# Patient Record
Sex: Male | Born: 1965 | Race: White | Hispanic: No | Marital: Single
Health system: Midwestern US, Academic
[De-identification: ages and names within clinical notes are randomized; demographics above are authoritative.]

---

## 2017-07-25 ENCOUNTER — Encounter: Admit: 2017-07-25 | Discharge: 2017-07-25 | Payer: MEDICARE

## 2017-07-25 DIAGNOSIS — C61 Malignant neoplasm of prostate: ICD-10-CM

## 2017-07-25 DIAGNOSIS — Z435 Encounter for attention to cystostomy: ICD-10-CM

## 2017-07-25 DIAGNOSIS — J45909 Unspecified asthma, uncomplicated: ICD-10-CM

## 2017-07-25 DIAGNOSIS — I10 Essential (primary) hypertension: Principal | ICD-10-CM

## 2017-07-25 DIAGNOSIS — N529 Male erectile dysfunction, unspecified: ICD-10-CM

## 2017-07-25 DIAGNOSIS — R339 Retention of urine, unspecified: ICD-10-CM

## 2017-07-25 DIAGNOSIS — N320 Bladder-neck obstruction: Principal | ICD-10-CM

## 2017-07-25 DIAGNOSIS — H9190 Unspecified hearing loss, unspecified ear: ICD-10-CM

## 2017-07-25 DIAGNOSIS — M549 Dorsalgia, unspecified: ICD-10-CM

## 2017-07-25 MED ORDER — OXYCODONE 5 MG PO TAB
5 mg | ORAL_TABLET | ORAL | 0 refills | 6.00000 days | Status: AC | PRN
Start: 2017-07-25 — End: 2017-07-28

## 2017-07-28 ENCOUNTER — Encounter: Admit: 2017-07-28 | Discharge: 2017-07-28 | Payer: MEDICARE

## 2017-07-30 ENCOUNTER — Encounter: Admit: 2017-07-30 | Discharge: 2017-07-30 | Payer: MEDICARE

## 2017-07-30 DIAGNOSIS — N320 Bladder-neck obstruction: Principal | ICD-10-CM

## 2017-07-31 ENCOUNTER — Encounter: Admit: 2017-07-31 | Discharge: 2017-07-31 | Payer: MEDICARE

## 2017-07-31 DIAGNOSIS — N320 Bladder-neck obstruction: Principal | ICD-10-CM

## 2017-08-01 ENCOUNTER — Ambulatory Visit: Admit: 2017-08-01 | Discharge: 2017-08-01 | Payer: MEDICARE

## 2017-08-01 DIAGNOSIS — N320 Bladder-neck obstruction: Principal | ICD-10-CM

## 2017-08-02 ENCOUNTER — Encounter: Admit: 2017-08-02 | Discharge: 2017-08-02 | Payer: MEDICARE

## 2017-08-02 ENCOUNTER — Ambulatory Visit: Admit: 2017-08-02 | Discharge: 2017-08-02 | Payer: MEDICARE

## 2017-08-02 DIAGNOSIS — J45909 Unspecified asthma, uncomplicated: ICD-10-CM

## 2017-08-02 DIAGNOSIS — I10 Essential (primary) hypertension: Principal | ICD-10-CM

## 2017-08-02 DIAGNOSIS — Z8546 Personal history of malignant neoplasm of prostate: ICD-10-CM

## 2017-08-02 DIAGNOSIS — R339 Retention of urine, unspecified: ICD-10-CM

## 2017-08-02 DIAGNOSIS — Z79899 Other long term (current) drug therapy: ICD-10-CM

## 2017-08-02 DIAGNOSIS — Z8249 Family history of ischemic heart disease and other diseases of the circulatory system: ICD-10-CM

## 2017-08-02 DIAGNOSIS — N320 Bladder-neck obstruction: Principal | ICD-10-CM

## 2017-08-02 DIAGNOSIS — Z9079 Acquired absence of other genital organ(s): ICD-10-CM

## 2017-08-02 DIAGNOSIS — N529 Male erectile dysfunction, unspecified: ICD-10-CM

## 2017-08-02 DIAGNOSIS — H9190 Unspecified hearing loss, unspecified ear: ICD-10-CM

## 2017-08-02 DIAGNOSIS — M549 Dorsalgia, unspecified: ICD-10-CM

## 2017-08-02 DIAGNOSIS — C61 Malignant neoplasm of prostate: ICD-10-CM

## 2017-08-02 MED ORDER — PHENAZOPYRIDINE 200 MG PO TAB
200 mg | ORAL_TABLET | Freq: Three times a day (TID) | ORAL | 0 refills | Status: AC | PRN
Start: 2017-08-02 — End: 2017-09-04

## 2017-08-02 MED ORDER — HYOSCYAMINE SULFATE 0.125 MG SL SUBL
125 ug | ORAL_TABLET | SUBLINGUAL | 0 refills | Status: AC | PRN
Start: 2017-08-02 — End: 2017-09-04

## 2017-08-02 MED ORDER — LACTATED RINGERS IV SOLP
1000 mL | INTRAVENOUS | 0 refills | Status: DC
Start: 2017-08-02 — End: 2017-08-02
  Administered 2017-08-02: 13:00:00 1000 mL via INTRAVENOUS

## 2017-08-02 MED ORDER — DEXAMETHASONE SODIUM PHOSPHATE 4 MG/ML IJ SOLN
INTRAVENOUS | 0 refills | Status: DC
Start: 2017-08-02 — End: 2017-08-02
  Administered 2017-08-02: 14:00:00 4 mg via INTRAVENOUS

## 2017-08-02 MED ORDER — FENTANYL CITRATE (PF) 50 MCG/ML IJ SOLN
50 ug | INTRAVENOUS | 0 refills | Status: DC | PRN
Start: 2017-08-02 — End: 2017-08-02

## 2017-08-02 MED ORDER — DEXAMETHASONE SODIUM PHOS (PF) 10 MG/ML IJ SOLN
4 mg | Freq: Once | INTRAVENOUS | 0 refills | Status: DC | PRN
Start: 2017-08-02 — End: 2017-08-02

## 2017-08-02 MED ORDER — SODIUM CHLORIDE 0.9% IRRIGATION BAG
0 refills | Status: DC
Start: 2017-08-02 — End: 2017-08-02
  Administered 2017-08-02: 14:00:00 3000 mL

## 2017-08-02 MED ORDER — MIDAZOLAM 1 MG/ML IJ SOLN
INTRAVENOUS | 0 refills | Status: DC
Start: 2017-08-02 — End: 2017-08-02
  Administered 2017-08-02: 13:00:00 2 mg via INTRAVENOUS

## 2017-08-02 MED ORDER — OXYBUTYNIN CHLORIDE 5 MG PO TAB
5 mg | ORAL_TABLET | Freq: Three times a day (TID) | ORAL | 0 refills | 12.00000 days | Status: AC | PRN
Start: 2017-08-02 — End: 2017-09-04

## 2017-08-02 MED ORDER — HYOSCYAMINE SULFATE 0.125 MG PO TBDI
.125 mg | Freq: Once | SUBLINGUAL | 0 refills | Status: CP
Start: 2017-08-02 — End: ?
  Administered 2017-08-02: 15:00:00 0.125 mg via SUBLINGUAL

## 2017-08-02 MED ORDER — PROPOFOL INJ 10 MG/ML IV VIAL
0 refills | Status: DC
Start: 2017-08-02 — End: 2017-08-02
  Administered 2017-08-02: 14:00:00 200 mg via INTRAVENOUS

## 2017-08-02 MED ORDER — DEXTRAN 70-HYPROMELLOSE (PF) 0.1-0.3 % OP DPET
0 refills | Status: DC
Start: 2017-08-02 — End: 2017-08-02
  Administered 2017-08-02: 14:00:00 2 [drp] via OPHTHALMIC

## 2017-08-02 MED ORDER — LIDOCAINE (PF) 200 MG/10 ML (2 %) IJ SYRG
0 refills | Status: DC
Start: 2017-08-02 — End: 2017-08-02
  Administered 2017-08-02: 14:00:00 100 mg via INTRAVENOUS

## 2017-08-02 MED ORDER — OXYCODONE 5 MG PO TAB
5 mg | ORAL_TABLET | ORAL | 0 refills | 6.00000 days | Status: AC | PRN
Start: 2017-08-02 — End: 2017-09-04

## 2017-08-02 MED ORDER — ONDANSETRON HCL (PF) 4 MG/2 ML IJ SOLN
INTRAVENOUS | 0 refills | Status: DC
Start: 2017-08-02 — End: 2017-08-02
  Administered 2017-08-02: 14:00:00 4 mg via INTRAVENOUS

## 2017-08-02 MED ORDER — OXYBUTYNIN CHLORIDE 5 MG PO TAB
5 mg | Freq: Once | ORAL | 0 refills | Status: CP
Start: 2017-08-02 — End: ?
  Administered 2017-08-02: 15:00:00 5 mg via ORAL

## 2017-08-02 MED ORDER — OXYBUTYNIN CHLORIDE 5 MG/5 ML PO SYRP
5 mg | Freq: Once | ORAL | 0 refills | Status: DC
Start: 2017-08-02 — End: 2017-08-02

## 2017-08-02 MED ORDER — PHENAZOPYRIDINE 200 MG PO TAB
200 mg | Freq: Once | ORAL | 0 refills | Status: CP
Start: 2017-08-02 — End: ?
  Administered 2017-08-02: 15:00:00 200 mg via ORAL

## 2017-08-02 MED ORDER — CEFAZOLIN 1 GRAM IJ SOLR
0 refills | Status: DC
Start: 2017-08-02 — End: 2017-08-02
  Administered 2017-08-02: 14:00:00 2 g via INTRAVENOUS

## 2017-08-02 MED ORDER — FENTANYL CITRATE (PF) 50 MCG/ML IJ SOLN
0 refills | Status: DC
Start: 2017-08-02 — End: 2017-08-02
  Administered 2017-08-02 (×2): 25 ug via INTRAVENOUS

## 2017-08-02 MED ORDER — ACETAMINOPHEN 325 MG PO TAB
650 mg | Freq: Once | ORAL | 0 refills | Status: CP
Start: 2017-08-02 — End: ?
  Administered 2017-08-02: 15:00:00 650 mg via ORAL

## 2017-08-04 ENCOUNTER — Encounter: Admit: 2017-08-04 | Discharge: 2017-08-04 | Payer: MEDICARE

## 2017-08-04 DIAGNOSIS — N529 Male erectile dysfunction, unspecified: ICD-10-CM

## 2017-08-04 DIAGNOSIS — H9190 Unspecified hearing loss, unspecified ear: ICD-10-CM

## 2017-08-04 DIAGNOSIS — I10 Essential (primary) hypertension: Principal | ICD-10-CM

## 2017-08-04 DIAGNOSIS — M549 Dorsalgia, unspecified: ICD-10-CM

## 2017-08-04 DIAGNOSIS — J45909 Unspecified asthma, uncomplicated: ICD-10-CM

## 2017-08-04 DIAGNOSIS — C61 Malignant neoplasm of prostate: ICD-10-CM

## 2017-09-04 ENCOUNTER — Encounter: Admit: 2017-09-04 | Discharge: 2017-09-04 | Payer: MEDICARE

## 2017-09-04 ENCOUNTER — Ambulatory Visit: Admit: 2017-09-04 | Discharge: 2017-09-05 | Payer: MEDICARE

## 2017-09-04 DIAGNOSIS — J45909 Unspecified asthma, uncomplicated: ICD-10-CM

## 2017-09-04 DIAGNOSIS — H9190 Unspecified hearing loss, unspecified ear: ICD-10-CM

## 2017-09-04 DIAGNOSIS — C61 Malignant neoplasm of prostate: ICD-10-CM

## 2017-09-04 DIAGNOSIS — I10 Essential (primary) hypertension: Principal | ICD-10-CM

## 2017-09-04 DIAGNOSIS — M549 Dorsalgia, unspecified: ICD-10-CM

## 2017-09-04 DIAGNOSIS — N529 Male erectile dysfunction, unspecified: ICD-10-CM

## 2017-09-05 ENCOUNTER — Encounter: Admit: 2017-09-05 | Discharge: 2017-09-05 | Payer: MEDICARE

## 2017-09-05 DIAGNOSIS — N320 Bladder-neck obstruction: Principal | ICD-10-CM

## 2017-09-05 LAB — CULTURE-URINE W/SENSITIVITY: Lab: 3

## 2017-09-10 ENCOUNTER — Encounter: Admit: 2017-09-10 | Discharge: 2017-09-10 | Payer: MEDICARE

## 2017-09-10 DIAGNOSIS — I10 Essential (primary) hypertension: Principal | ICD-10-CM

## 2017-09-10 DIAGNOSIS — M549 Dorsalgia, unspecified: ICD-10-CM

## 2017-09-10 DIAGNOSIS — N529 Male erectile dysfunction, unspecified: ICD-10-CM

## 2017-09-10 DIAGNOSIS — C61 Malignant neoplasm of prostate: ICD-10-CM

## 2017-09-10 DIAGNOSIS — J45909 Unspecified asthma, uncomplicated: ICD-10-CM

## 2017-09-10 DIAGNOSIS — H9190 Unspecified hearing loss, unspecified ear: ICD-10-CM

## 2017-09-18 ENCOUNTER — Encounter: Admit: 2017-09-18 | Discharge: 2017-09-18 | Payer: MEDICARE

## 2017-09-24 ENCOUNTER — Encounter: Admit: 2017-09-24 | Discharge: 2017-09-24 | Payer: MEDICARE

## 2017-09-24 ENCOUNTER — Encounter: Admit: 2017-09-24 | Discharge: 2017-09-24

## 2017-09-24 LAB — BASIC METABOLIC PANEL
Lab: 138 MMOL/L — ABNORMAL LOW (ref 137–147)
Lab: 43 mL/min — ABNORMAL LOW (ref >60)
Lab: 52 mL/min — ABNORMAL LOW (ref >60)
Lab: 8.6 mg/dL (ref 8.5–10.6)

## 2017-09-24 LAB — CBC: Lab: 15 10*3/uL — ABNORMAL HIGH (ref 4.5–11.0)

## 2017-09-24 MED ORDER — TRAZODONE 100 MG PO TAB
100 mg | Freq: Every evening | ORAL | 0 refills | Status: DC
Start: 2017-09-24 — End: 2017-09-28
  Administered 2017-09-25 – 2017-09-28 (×4): 100 mg via ORAL

## 2017-09-24 MED ORDER — OXYCODONE 5 MG PO TAB
5-10 mg | ORAL | 0 refills | Status: DC | PRN
Start: 2017-09-24 — End: 2017-09-28
  Administered 2017-09-24 – 2017-09-28 (×19): 10 mg via ORAL

## 2017-09-24 MED ORDER — PIPERACILLIN/TAZOBACTAM 3.375 G/NS IVPB (MB+)
3.375 g | INTRAVENOUS | 0 refills | Status: DC
Start: 2017-09-24 — End: 2017-09-27
  Administered 2017-09-24 – 2017-09-27 (×24): 3.375 g via INTRAVENOUS

## 2017-09-24 MED ORDER — LACTATED RINGERS IV SOLP
INTRAVENOUS | 0 refills | Status: DC
Start: 2017-09-24 — End: 2017-09-26
  Administered 2017-09-24 – 2017-09-26 (×4): 1000.000 mL via INTRAVENOUS

## 2017-09-24 MED ORDER — SODIUM CHLORIDE 0.9 % IV SOLP
INTRAVENOUS | 0 refills | Status: DC
Start: 2017-09-24 — End: 2017-09-24

## 2017-09-24 MED ORDER — ACETAMINOPHEN 325 MG PO TAB
650 mg | ORAL | 0 refills | Status: DC | PRN
Start: 2017-09-24 — End: 2017-09-28
  Administered 2017-09-24 – 2017-09-28 (×15): 650 mg via ORAL

## 2017-09-24 MED ORDER — DIPHENHYDRAMINE HCL 50 MG/ML IJ SOLN
25-50 mg | Freq: Every evening | INTRAVENOUS | 0 refills | Status: DC | PRN
Start: 2017-09-24 — End: 2017-09-28

## 2017-09-24 MED ORDER — FENTANYL CITRATE (PF) 50 MCG/ML IJ SOLN
25-50 ug | INTRAVENOUS | 0 refills | Status: DC | PRN
Start: 2017-09-24 — End: 2017-09-28
  Administered 2017-09-24 – 2017-09-26 (×9): 50 ug via INTRAVENOUS
  Administered 2017-09-28 (×2): 25 ug via INTRAVENOUS

## 2017-09-24 MED ORDER — POLYETHYLENE GLYCOL 3350 17 GRAM PO PWPK
1 | Freq: Every day | ORAL | 0 refills | Status: DC
Start: 2017-09-24 — End: 2017-09-28
  Administered 2017-09-24 – 2017-09-26 (×3): 17 g via ORAL

## 2017-09-24 MED ORDER — ONDANSETRON HCL (PF) 4 MG/2 ML IJ SOLN
4 mg | INTRAVENOUS | 0 refills | Status: DC | PRN
Start: 2017-09-24 — End: 2017-09-28
  Administered 2017-09-26: 04:00:00 4 mg via INTRAVENOUS

## 2017-09-24 MED ORDER — MELATONIN 5 MG PO TAB
5 mg | Freq: Every evening | ORAL | 0 refills | Status: DC | PRN
Start: 2017-09-24 — End: 2017-09-28

## 2017-09-24 MED ORDER — SERTRALINE 100 MG PO TAB
100 mg | Freq: Every day | ORAL | 0 refills | Status: DC
Start: 2017-09-24 — End: 2017-09-28
  Administered 2017-09-24 – 2017-09-28 (×5): 100 mg via ORAL

## 2017-09-25 ENCOUNTER — Inpatient Hospital Stay: Admit: 2017-09-25 | Discharge: 2017-09-25 | Payer: MEDICARE

## 2017-09-25 ENCOUNTER — Encounter: Admit: 2017-09-25 | Discharge: 2017-09-25 | Payer: MEDICARE

## 2017-09-25 DIAGNOSIS — M549 Dorsalgia, unspecified: ICD-10-CM

## 2017-09-25 DIAGNOSIS — H9190 Unspecified hearing loss, unspecified ear: ICD-10-CM

## 2017-09-25 DIAGNOSIS — J45909 Unspecified asthma, uncomplicated: ICD-10-CM

## 2017-09-25 DIAGNOSIS — C61 Malignant neoplasm of prostate: ICD-10-CM

## 2017-09-25 DIAGNOSIS — I10 Essential (primary) hypertension: Principal | ICD-10-CM

## 2017-09-25 DIAGNOSIS — N529 Male erectile dysfunction, unspecified: ICD-10-CM

## 2017-09-25 LAB — BASIC METABOLIC PANEL: Lab: 135 MMOL/L — ABNORMAL LOW (ref >60)

## 2017-09-25 LAB — CBC: Lab: 11 K/UL — ABNORMAL HIGH (ref 4.5–11.0)

## 2017-09-25 MED ORDER — MIDAZOLAM 1 MG/ML IJ SOLN
1-2 mg | Freq: Once | INTRAVENOUS | 0 refills | Status: CP
Start: 2017-09-25 — End: ?
  Administered 2017-09-25: 14:00:00 2 mg via INTRAVENOUS

## 2017-09-25 MED ORDER — FENTANYL CITRATE (PF) 50 MCG/ML IJ SOLN
0 refills | Status: CP
Start: 2017-09-25 — End: ?
  Administered 2017-09-25: 14:00:00 50 ug via INTRAVENOUS

## 2017-09-25 MED ORDER — FENTANYL CITRATE (PF) 50 MCG/ML IJ SOLN
50 ug | Freq: Once | INTRAVENOUS | 0 refills | Status: CP
Start: 2017-09-25 — End: ?
  Administered 2017-09-25: 14:00:00 50 ug via INTRAVENOUS

## 2017-09-25 MED ORDER — CEFTRIAXONE INJ 1GM IVP
1 g | Freq: Once | INTRAVENOUS | 0 refills | Status: CP
Start: 2017-09-25 — End: ?
  Administered 2017-09-25: 14:00:00 1 g via INTRAVENOUS

## 2017-09-25 MED ORDER — IOPAMIDOL 61 % IV SOLN
10 mL | Freq: Once | INTRA_ARTERIAL | 0 refills | Status: CP
Start: 2017-09-25 — End: ?
  Administered 2017-09-25: 14:00:00 10 mL via INTRA_ARTERIAL

## 2017-09-25 MED ORDER — SODIUM CHLORIDE 0.9 % IJ SYRG
10 mL | Freq: Two times a day (BID) | INTRAVENOUS | 0 refills | Status: DC
Start: 2017-09-25 — End: 2017-09-26
  Administered 2017-09-26 (×2): 10 mL via INTRAVENOUS

## 2017-09-26 ENCOUNTER — Encounter: Admit: 2017-09-26 | Discharge: 2017-09-26 | Payer: MEDICARE

## 2017-09-26 DIAGNOSIS — N201 Calculus of ureter: Principal | ICD-10-CM

## 2017-09-26 LAB — CULTURE-URINE W/SENSITIVITY

## 2017-09-26 LAB — BASIC METABOLIC PANEL
Lab: 136 MMOL/L — ABNORMAL LOW (ref 137–147)
Lab: 3.9 MMOL/L — ABNORMAL LOW (ref 3.5–5.1)

## 2017-09-26 LAB — CBC
Lab: 3.7 M/UL — ABNORMAL LOW (ref 4.4–5.5)
Lab: 8.5 K/UL — ABNORMAL LOW (ref 4.5–11.0)

## 2017-09-26 MED ORDER — SODIUM CHLORIDE 0.9 % IJ SYRG
10 mL | Freq: Two times a day (BID) | 0 refills | Status: DC
Start: 2017-09-26 — End: 2017-09-28
  Administered 2017-09-27 – 2017-09-28 (×4): 10 mL

## 2017-09-26 MED ORDER — HEPARIN, PORCINE (PF) 5,000 UNIT/0.5 ML IJ SYRG
5000 [IU] | SUBCUTANEOUS | 0 refills | Status: DC
Start: 2017-09-26 — End: 2017-09-28
  Administered 2017-09-26 – 2017-09-28 (×7): 5000 [IU] via SUBCUTANEOUS

## 2017-09-26 MED ORDER — SODIUM CHLORIDE 0.9 % IV SOLP
INTRAVENOUS | 0 refills | Status: DC
Start: 2017-09-26 — End: 2017-09-27
  Administered 2017-09-26 (×2): 1000.000 mL via INTRAVENOUS

## 2017-09-26 MED ADMIN — ALBUTEROL SULFATE 2.5 MG /3 ML (0.083 %) IN NEBU [250]: 2.5 mg | RESPIRATORY_TRACT | @ 22:00:00 | Stop: 2017-09-26 | NDC 00487950101

## 2017-09-26 MED ADMIN — SODIUM CHLORIDE 0.9 % IV SOLP [27838]: 250 mL | INTRAVENOUS | @ 07:00:00 | Stop: 2017-09-26 | NDC 00338004902

## 2017-09-27 LAB — BASIC METABOLIC PANEL: Lab: 139 MMOL/L — ABNORMAL LOW (ref >60)

## 2017-09-27 LAB — CBC: Lab: 6 K/UL — ABNORMAL HIGH (ref 4.5–11.0)

## 2017-09-27 MED ORDER — AMPICILLIN 500 MG PO CAP
500 mg | ORAL | 0 refills | Status: DC
Start: 2017-09-27 — End: 2017-09-28
  Administered 2017-09-27 – 2017-09-28 (×3): 500 mg via ORAL

## 2017-09-27 MED ORDER — HYOSCYAMINE SULFATE 0.125 MG PO TBDI
.125 mg | SUBLINGUAL | 0 refills | Status: DC | PRN
Start: 2017-09-27 — End: 2017-09-28
  Administered 2017-09-27: 21:00:00 0.125 mg via SUBLINGUAL

## 2017-09-27 MED ORDER — LISINOPRIL 10 MG PO TAB
10 mg | Freq: Every day | ORAL | 0 refills | Status: DC
Start: 2017-09-27 — End: 2017-09-28
  Administered 2017-09-27 – 2017-09-28 (×2): 10 mg via ORAL

## 2017-09-27 MED ORDER — LABETALOL 5 MG/ML IV SYRG
10 mg | INTRAVENOUS | 0 refills | Status: DC | PRN
Start: 2017-09-27 — End: 2017-09-28
  Administered 2017-09-28: 04:00:00 10 mg via INTRAVENOUS

## 2017-09-27 MED ORDER — ONDANSETRON HCL (PF) 4 MG/2 ML IJ SOLN
4-8 mg | INTRAVENOUS | 0 refills | Status: DC | PRN
Start: 2017-09-27 — End: 2017-09-28

## 2017-09-28 ENCOUNTER — Inpatient Hospital Stay
Admit: 2017-09-24 | Discharge: 2017-09-28 | Disposition: A | Discharge status: Home / Self Care | Payer: MEDICARE | Source: Other Acute Inpatient Hospital

## 2017-09-28 DIAGNOSIS — N320 Bladder-neck obstruction: ICD-10-CM

## 2017-09-28 DIAGNOSIS — J45909 Unspecified asthma, uncomplicated: ICD-10-CM

## 2017-09-28 DIAGNOSIS — I10 Essential (primary) hypertension: ICD-10-CM

## 2017-09-28 DIAGNOSIS — N132 Hydronephrosis with renal and ureteral calculous obstruction: Principal | ICD-10-CM

## 2017-09-28 DIAGNOSIS — Z9049 Acquired absence of other specified parts of digestive tract: ICD-10-CM

## 2017-09-28 DIAGNOSIS — Z9079 Acquired absence of other genital organ(s): ICD-10-CM

## 2017-09-28 DIAGNOSIS — R000 Tachycardia, unspecified: ICD-10-CM

## 2017-09-28 DIAGNOSIS — Z87442 Personal history of urinary calculi: ICD-10-CM

## 2017-09-28 DIAGNOSIS — Z8546 Personal history of malignant neoplasm of prostate: ICD-10-CM

## 2017-09-28 LAB — CULTURE-URINE W/SENSITIVITY

## 2017-09-28 LAB — BASIC METABOLIC PANEL: Lab: 143 MMOL/L — ABNORMAL LOW (ref >60)

## 2017-09-28 LAB — CBC: Lab: 6.3 10*3/uL — ABNORMAL LOW (ref 4.5–11.0)

## 2017-09-28 MED ORDER — HYOSCYAMINE SULFATE 0.125 MG PO TBDI
.125 mg | ORAL_TABLET | SUBLINGUAL | 0 refills | Status: SS | PRN
Start: 2017-09-28 — End: 2017-10-11

## 2017-09-28 MED ORDER — AMPICILLIN 500 MG PO CAP
500 mg | ORAL_CAPSULE | ORAL | 0 refills | Status: AC
Start: 2017-09-28 — End: 2017-10-11

## 2017-09-28 MED ORDER — ACETAMINOPHEN 325 MG PO TAB
650 mg | ORAL | 0 refills | Status: AC | PRN
Start: 2017-09-28 — End: 2018-01-01

## 2017-09-28 MED ORDER — POLYETHYLENE GLYCOL 3350 17 GRAM PO PWPK
17 g | Freq: Every day | ORAL | 0 refills | Status: SS
Start: 2017-09-28 — End: 2017-10-26

## 2017-09-30 LAB — CULTURE-BLOOD W/SENSITIVITY

## 2017-10-10 ENCOUNTER — Encounter: Admit: 2017-10-10 | Discharge: 2017-10-10 | Payer: MEDICARE

## 2017-10-10 ENCOUNTER — Encounter: Admit: 2017-10-10 | Discharge: 2017-10-11 | Payer: MEDICARE

## 2017-10-10 DIAGNOSIS — N202 Calculus of kidney with calculus of ureter: Principal | ICD-10-CM

## 2017-10-10 MED ORDER — PROPOFOL INJ 10 MG/ML IV VIAL
0 refills | Status: DC
Start: 2017-10-10 — End: 2017-10-10
  Administered 2017-10-10: 20:00:00 30 mg via INTRAVENOUS
  Administered 2017-10-10: 18:00:00 170 mg via INTRAVENOUS

## 2017-10-10 MED ORDER — MIDAZOLAM 1 MG/ML IJ SOLN
INTRAVENOUS | 0 refills | Status: DC
Start: 2017-10-10 — End: 2017-10-10
  Administered 2017-10-10: 18:00:00 2 mg via INTRAVENOUS

## 2017-10-10 MED ORDER — SUGAMMADEX 100 MG/ML IV SOLN
INTRAVENOUS | 0 refills | Status: DC
Start: 2017-10-10 — End: 2017-10-10
  Administered 2017-10-10: 21:00:00 194 mg via INTRAVENOUS

## 2017-10-10 MED ORDER — SODIUM CHLORIDE 0.9 % IV SOLP
INTRAVENOUS | 0 refills | Status: DC
Start: 2017-10-10 — End: 2017-10-11
  Administered 2017-10-10 – 2017-10-11 (×2): 1000.000 mL via INTRAVENOUS

## 2017-10-10 MED ORDER — OXYCODONE 5 MG PO TAB
5-15 mg | ORAL | 0 refills | Status: DC | PRN
Start: 2017-10-10 — End: 2017-10-11
  Administered 2017-10-11: 16:00:00 5 mg via ORAL
  Administered 2017-10-11: 03:00:00 10 mg via ORAL

## 2017-10-10 MED ORDER — FENTANYL CITRATE (PF) 50 MCG/ML IJ SOLN
25-50 ug | INTRAVENOUS | 0 refills | Status: DC | PRN
Start: 2017-10-10 — End: 2017-10-11

## 2017-10-10 MED ORDER — SODIUM CHLORIDE 0.9% IRRIGATION BAG
0 refills | Status: DC
Start: 2017-10-10 — End: 2017-10-10
  Administered 2017-10-10: 20:00:00 3000 mL

## 2017-10-10 MED ORDER — FENTANYL CITRATE (PF) 50 MCG/ML IJ SOLN
50 ug | Freq: Once | INTRAVENOUS | 0 refills | Status: CP
Start: 2017-10-10 — End: ?
  Administered 2017-10-10: 22:00:00 50 ug via INTRAVENOUS

## 2017-10-10 MED ORDER — HYOSCYAMINE SULFATE 0.125 MG PO TBDI
.125 mg | SUBLINGUAL | 0 refills | Status: DC | PRN
Start: 2017-10-10 — End: 2017-10-11
  Administered 2017-10-10: 22:00:00 0.125 mg via SUBLINGUAL

## 2017-10-10 MED ORDER — TRAZODONE 100 MG PO TAB
100 mg | Freq: Every evening | ORAL | 0 refills | Status: DC
Start: 2017-10-10 — End: 2017-10-11
  Administered 2017-10-11: 03:00:00 100 mg via ORAL

## 2017-10-10 MED ORDER — DEXAMETHASONE SODIUM PHOSPHATE 4 MG/ML IJ SOLN
INTRAVENOUS | 0 refills | Status: DC
Start: 2017-10-10 — End: 2017-10-10
  Administered 2017-10-10: 18:00:00 4 mg via INTRAVENOUS

## 2017-10-10 MED ORDER — SERTRALINE 100 MG PO TAB
100 mg | Freq: Every day | ORAL | 0 refills | Status: DC
Start: 2017-10-10 — End: 2017-10-11
  Administered 2017-10-11 (×2): 100 mg via ORAL

## 2017-10-10 MED ORDER — DEXTRAN 70-HYPROMELLOSE (PF) 0.1-0.3 % OP DPET
0 refills | Status: DC
Start: 2017-10-10 — End: 2017-10-10
  Administered 2017-10-10: 18:00:00 2 [drp] via OPHTHALMIC

## 2017-10-10 MED ORDER — DOCUSATE SODIUM 100 MG PO CAP
100 mg | Freq: Two times a day (BID) | ORAL | 0 refills | Status: DC
Start: 2017-10-10 — End: 2017-10-11
  Administered 2017-10-11: 03:00:00 100 mg via ORAL

## 2017-10-10 MED ORDER — TAMSULOSIN 0.4 MG PO CAP
0.4 mg | Freq: Every day | ORAL | 0 refills | Status: DC
Start: 2017-10-10 — End: 2017-10-11

## 2017-10-10 MED ORDER — OXYCODONE 5 MG PO TAB
5-10 mg | Freq: Once | ORAL | 0 refills | Status: CP
Start: 2017-10-10 — End: ?
  Administered 2017-10-10: 22:00:00 10 mg via ORAL

## 2017-10-10 MED ORDER — LIDOCAINE (PF) 200 MG/10 ML (2 %) IJ SYRG
0 refills | Status: DC
Start: 2017-10-10 — End: 2017-10-10
  Administered 2017-10-10: 18:00:00 100 mg via INTRAVENOUS

## 2017-10-10 MED ORDER — ROCURONIUM 10 MG/ML IV SOLN
INTRAVENOUS | 0 refills | Status: DC
Start: 2017-10-10 — End: 2017-10-10
  Administered 2017-10-10 (×2): 10 mg via INTRAVENOUS
  Administered 2017-10-10: 18:00:00 100 mg via INTRAVENOUS
  Administered 2017-10-10: 19:00:00 10 mg via INTRAVENOUS

## 2017-10-10 MED ORDER — AMPICILLIN 1G/100ML NS IVPB (MB+)
1 g | Freq: Once | INTRAVENOUS | 0 refills | Status: CP
Start: 2017-10-10 — End: ?
  Administered 2017-10-10 (×2): 1 g via INTRAVENOUS

## 2017-10-10 MED ORDER — FENTANYL CITRATE (PF) 50 MCG/ML IJ SOLN
50 ug | Freq: Once | INTRAVENOUS | 0 refills | Status: CP
Start: 2017-10-10 — End: ?
  Administered 2017-10-10: 21:00:00 50 ug via INTRAVENOUS

## 2017-10-10 MED ORDER — ONDANSETRON HCL (PF) 4 MG/2 ML IJ SOLN
INTRAVENOUS | 0 refills | Status: DC
Start: 2017-10-10 — End: 2017-10-10
  Administered 2017-10-10: 21:00:00 4 mg via INTRAVENOUS

## 2017-10-10 MED ORDER — LIDOCAINE (PF) 10 MG/ML (1 %) IJ SOLN
.1-2 mL | INTRAMUSCULAR | 0 refills | Status: DC | PRN
Start: 2017-10-10 — End: 2017-10-11

## 2017-10-10 MED ORDER — AMPICILLIN/SULBACTAM 3G/100ML NS IVPB (MB+)
3 g | INTRAVENOUS | 0 refills | Status: DC
Start: 2017-10-10 — End: 2017-10-11
  Administered 2017-10-11 (×6): 3 g via INTRAVENOUS

## 2017-10-10 MED ORDER — FENTANYL CITRATE (PF) 50 MCG/ML IJ SOLN
0 refills | Status: DC
Start: 2017-10-10 — End: 2017-10-10
  Administered 2017-10-10: 18:00:00 100 ug via INTRAVENOUS
  Administered 2017-10-10: 19:00:00 50 ug via INTRAVENOUS
  Administered 2017-10-10: 18:00:00 100 ug via INTRAVENOUS

## 2017-10-10 MED ORDER — LEVOFLOXACIN IN D5W 500 MG/100 ML IV PGBK
500 mg | INTRAVENOUS | 0 refills | Status: DC
Start: 2017-10-10 — End: 2017-10-11

## 2017-10-10 MED ORDER — PHENYLEPHRINE IN 0.9% NACL(PF) 1 MG/10 ML (100 MCG/ML) IV SYRG
INTRAVENOUS | 0 refills | Status: DC
Start: 2017-10-10 — End: 2017-10-10
  Administered 2017-10-10 (×5): 100 ug via INTRAVENOUS

## 2017-10-10 MED ORDER — LACTATED RINGERS IV SOLP
1000 mL | INTRAVENOUS | 0 refills | Status: DC
Start: 2017-10-10 — End: 2017-10-11
  Administered 2017-10-10 (×2): 1000.000 mL via INTRAVENOUS

## 2017-10-10 MED ORDER — LISINOPRIL 10 MG PO TAB
10 mg | Freq: Every day | ORAL | 0 refills | Status: DC
Start: 2017-10-10 — End: 2017-10-11

## 2017-10-10 MED ORDER — GENTAMICIN IVPB (CONVENTIONAL)
430 mg | Freq: Once | INTRAVENOUS | 0 refills | Status: CP
Start: 2017-10-10 — End: ?
  Administered 2017-10-10 (×2): 430 mg via INTRAVENOUS

## 2017-10-10 MED ORDER — IOPAMIDOL 61 % 50 ML IV SOLN (OT)(OSM)
0 refills | Status: DC
Start: 2017-10-10 — End: 2017-10-10
  Administered 2017-10-10: 20:00:00 50 mL via INTRAMUSCULAR

## 2017-10-10 MED ORDER — ACETAMINOPHEN 500 MG PO TAB
1000 mg | ORAL | 0 refills | Status: DC
Start: 2017-10-10 — End: 2017-10-11
  Administered 2017-10-10 – 2017-10-11 (×2): 1000 mg via ORAL

## 2017-10-10 MED ADMIN — LACTATED RINGERS IV SOLP [4318]: 1000 mL | INTRAVENOUS | @ 16:00:00 | Stop: 2017-10-10 | NDC 00338011704

## 2017-10-11 ENCOUNTER — Encounter: Admit: 2017-10-11 | Discharge: 2017-10-11 | Payer: MEDICARE

## 2017-10-11 ENCOUNTER — Encounter: Admit: 2017-10-09 | Discharge: 2017-10-09 | Payer: MEDICARE

## 2017-10-11 DIAGNOSIS — I10 Essential (primary) hypertension: ICD-10-CM

## 2017-10-11 DIAGNOSIS — Z8546 Personal history of malignant neoplasm of prostate: ICD-10-CM

## 2017-10-11 DIAGNOSIS — N320 Bladder-neck obstruction: ICD-10-CM

## 2017-10-11 DIAGNOSIS — N202 Calculus of kidney with calculus of ureter: Principal | ICD-10-CM

## 2017-10-11 DIAGNOSIS — Z9079 Acquired absence of other genital organ(s): ICD-10-CM

## 2017-10-11 LAB — CBC: Lab: 10 10*3/uL — ABNORMAL HIGH (ref <150)

## 2017-10-11 LAB — BASIC METABOLIC PANEL: Lab: 139 MMOL/L — ABNORMAL LOW (ref <100)

## 2017-10-11 MED ORDER — HYOSCYAMINE SULFATE 0.125 MG PO TBDI
.125 mg | ORAL_TABLET | SUBLINGUAL | 3 refills | Status: SS | PRN
Start: 2017-10-11 — End: 2017-10-26

## 2017-10-11 MED ORDER — TAMSULOSIN 0.4 MG PO CAP
0.4 mg | ORAL_CAPSULE | Freq: Every day | ORAL | 3 refills | 90.00000 days | Status: AC
Start: 2017-10-11 — End: 2017-10-26

## 2017-10-11 MED ORDER — OXYBUTYNIN CHLORIDE 5 MG PO TAB
5 mg | ORAL_TABLET | Freq: Three times a day (TID) | ORAL | 3 refills | Status: SS
Start: 2017-10-11 — End: 2017-10-26

## 2017-10-11 MED ORDER — OXYCODONE 5 MG PO TAB
5-15 mg | ORAL_TABLET | ORAL | 0 refills | 6.00000 days | Status: AC | PRN
Start: 2017-10-11 — End: 2017-10-26

## 2017-10-12 ENCOUNTER — Encounter: Admit: 2017-10-12 | Discharge: 2017-10-12 | Payer: MEDICARE

## 2017-10-12 DIAGNOSIS — H9190 Unspecified hearing loss, unspecified ear: ICD-10-CM

## 2017-10-12 DIAGNOSIS — C61 Malignant neoplasm of prostate: ICD-10-CM

## 2017-10-12 DIAGNOSIS — I10 Essential (primary) hypertension: Principal | ICD-10-CM

## 2017-10-12 DIAGNOSIS — M549 Dorsalgia, unspecified: ICD-10-CM

## 2017-10-12 DIAGNOSIS — J45909 Unspecified asthma, uncomplicated: ICD-10-CM

## 2017-10-12 DIAGNOSIS — N529 Male erectile dysfunction, unspecified: ICD-10-CM

## 2017-10-12 LAB — STONE ANALYSIS: Lab: 100

## 2017-10-23 ENCOUNTER — Encounter: Admit: 2017-10-23 | Discharge: 2017-10-23 | Payer: MEDICARE

## 2017-10-23 DIAGNOSIS — N320 Bladder-neck obstruction: Principal | ICD-10-CM

## 2017-10-24 ENCOUNTER — Encounter: Admit: 2017-10-24 | Discharge: 2017-10-24 | Payer: MEDICARE

## 2017-10-24 DIAGNOSIS — N320 Bladder-neck obstruction: Principal | ICD-10-CM

## 2017-10-24 MED ORDER — CEFAZOLIN INJ 1GM IVP
2 g | Freq: Once | INTRAVENOUS | 0 refills | Status: CP
Start: 2017-10-24 — End: ?
  Administered 2017-10-24 (×2): 2 g via INTRAVENOUS

## 2017-10-24 MED ORDER — ENOXAPARIN 40 MG/0.4 ML SC SYRG
40 mg | Freq: Every day | SUBCUTANEOUS | 0 refills | Status: DC
Start: 2017-10-24 — End: 2017-10-26
  Administered 2017-10-25 – 2017-10-26 (×2): 40 mg via SUBCUTANEOUS

## 2017-10-24 MED ORDER — LIDOCAINE (PF) 200 MG/10 ML (2 %) IJ SYRG
0 refills | Status: DC
Start: 2017-10-24 — End: 2017-10-24
  Administered 2017-10-24: 13:00:00 100 mg via INTRAVENOUS

## 2017-10-24 MED ORDER — GLYCOPYRROLATE 0.2 MG/ML IJ SOLN
0 refills | Status: DC
Start: 2017-10-24 — End: 2017-10-24
  Administered 2017-10-24: 18:00:00 0.6 mg via INTRAVENOUS

## 2017-10-24 MED ORDER — MEPERIDINE (PF) 25 MG/ML IJ SYRG
12.5 mg | INTRAVENOUS | 0 refills | Status: CP | PRN
Start: 2017-10-24 — End: ?
  Administered 2017-10-24 (×2): 12.5 mg via INTRAVENOUS

## 2017-10-24 MED ORDER — TRAZODONE 100 MG PO TAB
100 mg | Freq: Every evening | ORAL | 0 refills | Status: DC
Start: 2017-10-24 — End: 2017-10-26
  Administered 2017-10-25 – 2017-10-26 (×2): 100 mg via ORAL

## 2017-10-24 MED ORDER — BUPIVACAINE 0.25 % (2.5 MG/ML) IJ SOLN
0 refills | Status: DC
Start: 2017-10-24 — End: 2017-10-24
  Administered 2017-10-24: 18:00:00 50 mL via INTRAMUSCULAR

## 2017-10-24 MED ORDER — BACITRACIN ZINC 500 UNIT/GRAM TP OINT
Freq: Three times a day (TID) | TOPICAL | 0 refills | Status: DC
Start: 2017-10-24 — End: 2017-10-26
  Administered 2017-10-24 – 2017-10-25 (×2): via TOPICAL

## 2017-10-24 MED ORDER — DEXAMETHASONE SODIUM PHOSPHATE 4 MG/ML IJ SOLN
INTRAVENOUS | 0 refills | Status: DC
Start: 2017-10-24 — End: 2017-10-24
  Administered 2017-10-24: 13:00:00 4 mg via INTRAVENOUS

## 2017-10-24 MED ORDER — HALOPERIDOL LACTATE 5 MG/ML IJ SOLN
1 mg | Freq: Once | INTRAVENOUS | 0 refills | Status: CP | PRN
Start: 2017-10-24 — End: ?
  Administered 2017-10-24: 20:00:00 1 mg via INTRAVENOUS

## 2017-10-24 MED ORDER — OXYCODONE 5 MG PO TAB
5-15 mg | ORAL | 0 refills | Status: DC | PRN
Start: 2017-10-24 — End: 2017-10-25
  Administered 2017-10-24: 22:00:00 5 mg via ORAL
  Administered 2017-10-25: 08:00:00 10 mg via ORAL
  Administered 2017-10-25: 11:00:00 15 mg via ORAL
  Administered 2017-10-25: 01:00:00 10 mg via ORAL

## 2017-10-24 MED ORDER — FENTANYL CITRATE (PF) 50 MCG/ML IJ SOLN
25-50 ug | INTRAVENOUS | 0 refills | Status: DC | PRN
Start: 2017-10-24 — End: 2017-10-26

## 2017-10-24 MED ORDER — ONDANSETRON HCL (PF) 4 MG/2 ML IJ SOLN
INTRAVENOUS | 0 refills | Status: DC
Start: 2017-10-24 — End: 2017-10-24
  Administered 2017-10-24: 18:00:00 4 mg via INTRAVENOUS

## 2017-10-24 MED ORDER — ROCURONIUM 10 MG/ML IV SOLN
INTRAVENOUS | 0 refills | Status: DC
Start: 2017-10-24 — End: 2017-10-24
  Administered 2017-10-24 (×2): 10 mg via INTRAVENOUS
  Administered 2017-10-24: 13:00:00 60 mg via INTRAVENOUS
  Administered 2017-10-24 (×11): 10 mg via INTRAVENOUS

## 2017-10-24 MED ORDER — SODIUM CHLORIDE 0.9 % IV SOLP
1000 mL | Freq: Once | INTRAVENOUS | 0 refills | Status: DC
Start: 2017-10-24 — End: 2017-10-24
  Administered 2017-10-24: 22:00:00 1000 mL via INTRAVENOUS

## 2017-10-24 MED ORDER — FENTANYL CITRATE (PF) 50 MCG/ML IJ SOLN
50 ug | INTRAVENOUS | 0 refills | Status: DC | PRN
Start: 2017-10-24 — End: 2017-10-24
  Administered 2017-10-24 (×4): 50 ug via INTRAVENOUS

## 2017-10-24 MED ORDER — FENTANYL CITRATE (PF) 50 MCG/ML IJ SOLN
0 refills | Status: DC
Start: 2017-10-24 — End: 2017-10-24
  Administered 2017-10-24 (×2): 50 ug via INTRAVENOUS
  Administered 2017-10-24 (×2): 25 ug via INTRAVENOUS
  Administered 2017-10-24: 16:00:00 50 ug via INTRAVENOUS
  Administered 2017-10-24: 13:00:00 100 ug via INTRAVENOUS
  Administered 2017-10-24: 14:00:00 50 ug via INTRAVENOUS

## 2017-10-24 MED ORDER — HYOSCYAMINE SULFATE 0.125 MG PO TBDI
.125 mg | SUBLINGUAL | 0 refills | Status: DC | PRN
Start: 2017-10-24 — End: 2017-10-26
  Administered 2017-10-24: 19:00:00 0.125 mg via SUBLINGUAL

## 2017-10-24 MED ORDER — LACTATED RINGERS IV SOLP
1000 mL | INTRAVENOUS | 0 refills | Status: DC
Start: 2017-10-24 — End: 2017-10-24
  Administered 2017-10-24: 15:00:00 1000.000 mL via INTRAVENOUS
  Administered 2017-10-24 (×2): 1000 mL via INTRAVENOUS

## 2017-10-24 MED ORDER — DOCUSATE SODIUM 100 MG PO CAP
100 mg | Freq: Two times a day (BID) | ORAL | 0 refills | Status: DC
Start: 2017-10-24 — End: 2017-10-26
  Administered 2017-10-25 – 2017-10-26 (×3): 100 mg via ORAL

## 2017-10-24 MED ORDER — MIDAZOLAM 1 MG/ML IJ SOLN
INTRAVENOUS | 0 refills | Status: DC
Start: 2017-10-24 — End: 2017-10-24
  Administered 2017-10-24: 13:00:00 2 mg via INTRAVENOUS

## 2017-10-24 MED ORDER — LIDOCAINE (PF) 10 MG/ML (1 %) IJ SOLN
.1-2 mL | INTRAMUSCULAR | 0 refills | Status: DC | PRN
Start: 2017-10-24 — End: 2017-10-24

## 2017-10-24 MED ORDER — SERTRALINE 100 MG PO TAB
100 mg | Freq: Every day | ORAL | 0 refills | Status: DC
Start: 2017-10-24 — End: 2017-10-26
  Administered 2017-10-24 – 2017-10-26 (×3): 100 mg via ORAL

## 2017-10-24 MED ORDER — PROPOFOL INJ 10 MG/ML IV VIAL
0 refills | Status: DC
Start: 2017-10-24 — End: 2017-10-24
  Administered 2017-10-24: 13:00:00 200 mg via INTRAVENOUS
  Administered 2017-10-24: 13:00:00 50 mg via INTRAVENOUS

## 2017-10-24 MED ORDER — ONDANSETRON HCL (PF) 4 MG/2 ML IJ SOLN
4 mg | INTRAVENOUS | 0 refills | Status: DC | PRN
Start: 2017-10-24 — End: 2017-10-26

## 2017-10-24 MED ORDER — CEFAZOLIN INJ 1GM IVP
2 g | INTRAVENOUS | 0 refills | Status: CP
Start: 2017-10-24 — End: ?
  Administered 2017-10-25 (×3): 2 g via INTRAVENOUS

## 2017-10-24 MED ORDER — LISINOPRIL 10 MG PO TAB
10 mg | Freq: Every day | ORAL | 0 refills | Status: DC
Start: 2017-10-24 — End: 2017-10-25
  Administered 2017-10-24: 22:00:00 10 mg via ORAL

## 2017-10-24 MED ORDER — OXYCODONE 5 MG PO TAB
5-10 mg | Freq: Once | ORAL | 0 refills | Status: CP | PRN
Start: 2017-10-24 — End: ?

## 2017-10-24 MED ORDER — SODIUM CHLORIDE 0.9 % IV SOLP
INTRAVENOUS | 0 refills | Status: DC
Start: 2017-10-24 — End: 2017-10-26
  Administered 2017-10-24 – 2017-10-26 (×5): 1000.000 mL via INTRAVENOUS

## 2017-10-24 MED ORDER — DIPHENHYDRAMINE HCL 50 MG/ML IJ SOLN
25 mg | Freq: Once | INTRAVENOUS | 0 refills | Status: DC | PRN
Start: 2017-10-24 — End: 2017-10-24

## 2017-10-24 MED ORDER — NEOSTIGMINE METHYLSULFATE 1 MG/ML IJ SOLN
0 refills | Status: DC
Start: 2017-10-24 — End: 2017-10-24
  Administered 2017-10-24: 18:00:00 4 mg via INTRAVENOUS

## 2017-10-24 MED ORDER — KETOROLAC 15 MG/ML IJ SOLN
15 mg | INTRAVENOUS | 0 refills | Status: DC
Start: 2017-10-24 — End: 2017-10-26
  Administered 2017-10-24 – 2017-10-26 (×5): 15 mg via INTRAVENOUS

## 2017-10-24 MED ORDER — PROMETHAZINE 25 MG/ML IJ SOLN
6.25 mg | INTRAVENOUS | 0 refills | Status: DC | PRN
Start: 2017-10-24 — End: 2017-10-24

## 2017-10-24 MED ORDER — ACETAMINOPHEN 500 MG PO TAB
1000 mg | ORAL | 0 refills | Status: DC
Start: 2017-10-24 — End: 2017-10-26
  Administered 2017-10-25 – 2017-10-26 (×7): 1000 mg via ORAL

## 2017-10-24 MED ORDER — HYDROMORPHONE (PF) 2 MG/ML IJ SYRG
.5-1 mg | INTRAVENOUS | 0 refills | Status: DC | PRN
Start: 2017-10-24 — End: 2017-10-24
  Administered 2017-10-24: 20:00:00 1 mg via INTRAVENOUS

## 2017-10-24 MED ORDER — DEXTRAN 70-HYPROMELLOSE (PF) 0.1-0.3 % OP DPET
0 refills | Status: DC
Start: 2017-10-24 — End: 2017-10-24
  Administered 2017-10-24: 13:00:00 2 [drp] via OPHTHALMIC

## 2017-10-25 ENCOUNTER — Encounter: Admit: 2017-10-25 | Discharge: 2017-10-25 | Payer: MEDICARE

## 2017-10-25 LAB — CBC: Lab: 8.8 10*3/uL — ABNORMAL HIGH (ref 4.5–11.0)

## 2017-10-25 LAB — BASIC METABOLIC PANEL: Lab: 137 MMOL/L — ABNORMAL LOW (ref >60)

## 2017-10-25 MED ORDER — POLYETHYLENE GLYCOL 3350 17 GRAM PO PWPK
1 | Freq: Two times a day (BID) | ORAL | 0 refills | Status: DC
Start: 2017-10-25 — End: 2017-10-26
  Administered 2017-10-26: 02:00:00 17 g via ORAL

## 2017-10-25 MED ORDER — LISINOPRIL 10 MG PO TAB
10 mg | Freq: Every day | ORAL | 0 refills | Status: DC
Start: 2017-10-25 — End: 2017-10-26
  Administered 2017-10-25 – 2017-10-26 (×2): 10 mg via ORAL

## 2017-10-25 MED ORDER — OXYCODONE 5 MG PO TAB
5-15 mg | ORAL | 0 refills | Status: DC | PRN
Start: 2017-10-25 — End: 2017-10-26
  Administered 2017-10-25 – 2017-10-26 (×4): 10 mg via ORAL

## 2017-10-25 MED ORDER — SENNOSIDES-DOCUSATE SODIUM 8.6-50 MG PO TAB
1 | Freq: Two times a day (BID) | ORAL | 0 refills | Status: DC
Start: 2017-10-25 — End: 2017-10-26
  Administered 2017-10-26: 02:00:00 1 via ORAL

## 2017-10-25 MED ORDER — BELLADONNA ALKALOIDS-OPIUM 16.2-60 MG RE SUPP
1 | RECTAL | 0 refills | Status: DC | PRN
Start: 2017-10-25 — End: 2017-10-26

## 2017-10-26 ENCOUNTER — Encounter: Admit: 2017-10-26 | Discharge: 2017-10-26 | Payer: MEDICARE

## 2017-10-26 ENCOUNTER — Encounter: Admit: 2017-10-24 | Discharge: 2017-10-26 | Payer: MEDICARE

## 2017-10-26 DIAGNOSIS — Z9079 Acquired absence of other genital organ(s): ICD-10-CM

## 2017-10-26 DIAGNOSIS — Z87442 Personal history of urinary calculi: ICD-10-CM

## 2017-10-26 DIAGNOSIS — H9190 Unspecified hearing loss, unspecified ear: ICD-10-CM

## 2017-10-26 DIAGNOSIS — Z8546 Personal history of malignant neoplasm of prostate: ICD-10-CM

## 2017-10-26 DIAGNOSIS — N3289 Other specified disorders of bladder: ICD-10-CM

## 2017-10-26 DIAGNOSIS — J45909 Unspecified asthma, uncomplicated: ICD-10-CM

## 2017-10-26 DIAGNOSIS — M549 Dorsalgia, unspecified: ICD-10-CM

## 2017-10-26 DIAGNOSIS — I10 Essential (primary) hypertension: ICD-10-CM

## 2017-10-26 DIAGNOSIS — Z936 Other artificial openings of urinary tract status: ICD-10-CM

## 2017-10-26 DIAGNOSIS — C61 Malignant neoplasm of prostate: ICD-10-CM

## 2017-10-26 DIAGNOSIS — N529 Male erectile dysfunction, unspecified: ICD-10-CM

## 2017-10-26 MED ORDER — BACITRACIN ZINC 500 UNIT/GRAM TP OINT
1 refills | 14.00000 days | Status: AC
Start: 2017-10-26 — End: 2018-01-01

## 2017-10-26 MED ORDER — DOCUSATE SODIUM 100 MG PO CAP
100 mg | ORAL_CAPSULE | Freq: Two times a day (BID) | ORAL | 0 refills | Status: AC
Start: 2017-10-26 — End: ?

## 2017-10-26 MED ORDER — SENNOSIDES-DOCUSATE SODIUM 8.6-50 MG PO TAB
1 | ORAL_TABLET | Freq: Two times a day (BID) | ORAL | 0 refills | Status: AC
Start: 2017-10-26 — End: 2017-11-06

## 2017-10-26 MED ORDER — DOCUSATE SODIUM 100 MG PO CAP
100 mg | ORAL_CAPSULE | Freq: Two times a day (BID) | ORAL | 0 refills | Status: AC
Start: 2017-10-26 — End: 2017-10-26

## 2017-10-26 MED ORDER — HYOSCYAMINE SULFATE 0.125 MG SL SUBL
125 ug | ORAL_TABLET | SUBLINGUAL | 2 refills | Status: AC | PRN
Start: 2017-10-26 — End: 2017-11-06

## 2017-10-26 MED ORDER — HYOSCYAMINE SULFATE 0.125 MG PO TBDI
.125 mg | ORAL_TABLET | SUBLINGUAL | 3 refills | Status: AC | PRN
Start: 2017-10-26 — End: 2017-11-06

## 2017-10-26 MED ORDER — POLYETHYLENE GLYCOL 3350 17 GRAM PO PWPK
17 g | Freq: Every day | ORAL | 1 refills | 22.00000 days | Status: AC
Start: 2017-10-26 — End: 2017-10-26

## 2017-10-26 MED ORDER — OXYBUTYNIN CHLORIDE 5 MG PO TAB
5 mg | ORAL_TABLET | Freq: Three times a day (TID) | ORAL | 1 refills | 12.00000 days | Status: AC
Start: 2017-10-26 — End: 2017-10-26

## 2017-10-26 MED ORDER — CIPROFLOXACIN HCL 500 MG PO TAB
500 mg | ORAL_TABLET | Freq: Two times a day (BID) | ORAL | 0 refills | 10.00000 days | Status: AC
Start: 2017-10-26 — End: 2017-10-26

## 2017-10-26 MED ORDER — HYOSCYAMINE SULFATE 0.125 MG SL SUBL
125 ug | ORAL_TABLET | SUBLINGUAL | 2 refills | Status: AC | PRN
Start: 2017-10-26 — End: 2017-10-26

## 2017-10-26 MED ORDER — SENNOSIDES-DOCUSATE SODIUM 8.6-50 MG PO TAB
1 | ORAL_TABLET | Freq: Two times a day (BID) | ORAL | 0 refills | Status: AC
Start: 2017-10-26 — End: 2017-10-26

## 2017-10-26 MED ORDER — OXYBUTYNIN CHLORIDE 5 MG PO TAB
5 mg | ORAL_TABLET | Freq: Three times a day (TID) | ORAL | 1 refills | 12.00000 days | Status: AC
Start: 2017-10-26 — End: 2017-11-06

## 2017-10-26 MED ORDER — OXYCODONE 5 MG PO TAB
5-15 mg | ORAL_TABLET | ORAL | 0 refills | 6.00000 days | Status: AC | PRN
Start: 2017-10-26 — End: 2017-11-06

## 2017-10-26 MED ORDER — BACITRACIN ZINC 500 UNIT/GRAM TP OINT
1 refills | 14.00000 days | Status: AC
Start: 2017-10-26 — End: 2017-10-26

## 2017-10-26 MED ORDER — CIPROFLOXACIN HCL 500 MG PO TAB
500 mg | ORAL_TABLET | Freq: Two times a day (BID) | ORAL | 0 refills | 10.00000 days | Status: AC
Start: 2017-10-26 — End: 2017-11-01

## 2017-10-26 MED ORDER — POLYETHYLENE GLYCOL 3350 17 GRAM PO PWPK
17 g | Freq: Every day | ORAL | 1 refills | 18.00000 days | Status: AC
Start: 2017-10-26 — End: 2017-11-06

## 2017-11-01 ENCOUNTER — Encounter: Admit: 2017-11-01 | Discharge: 2017-11-01 | Payer: MEDICARE

## 2017-11-01 MED ORDER — SULFAMETHOXAZOLE-TRIMETHOPRIM 800-160 MG PO TAB
1 | ORAL_TABLET | Freq: Two times a day (BID) | ORAL | 0 refills | Status: AC
Start: 2017-11-01 — End: ?

## 2017-11-06 ENCOUNTER — Ambulatory Visit: Admit: 2017-11-06 | Discharge: 2017-11-07 | Payer: MEDICARE

## 2017-11-06 ENCOUNTER — Encounter: Admit: 2017-11-06 | Discharge: 2017-11-06 | Payer: MEDICARE

## 2017-11-06 DIAGNOSIS — M549 Dorsalgia, unspecified: ICD-10-CM

## 2017-11-06 DIAGNOSIS — H9190 Unspecified hearing loss, unspecified ear: ICD-10-CM

## 2017-11-06 DIAGNOSIS — C61 Malignant neoplasm of prostate: ICD-10-CM

## 2017-11-06 DIAGNOSIS — I10 Essential (primary) hypertension: Principal | ICD-10-CM

## 2017-11-06 DIAGNOSIS — J45909 Unspecified asthma, uncomplicated: ICD-10-CM

## 2017-11-06 DIAGNOSIS — N529 Male erectile dysfunction, unspecified: ICD-10-CM

## 2017-11-07 DIAGNOSIS — C61 Malignant neoplasm of prostate: Principal | ICD-10-CM

## 2018-01-01 ENCOUNTER — Ambulatory Visit: Admit: 2018-01-01 | Discharge: 2018-01-01

## 2018-01-01 ENCOUNTER — Encounter: Admit: 2018-01-01 | Discharge: 2018-01-01 | Payer: MEDICARE

## 2018-01-01 ENCOUNTER — Ambulatory Visit: Admit: 2018-01-01 | Discharge: 2018-01-01 | Payer: MEDICARE

## 2018-01-01 DIAGNOSIS — N529 Male erectile dysfunction, unspecified: ICD-10-CM

## 2018-01-01 DIAGNOSIS — C61 Malignant neoplasm of prostate: ICD-10-CM

## 2018-01-01 DIAGNOSIS — N320 Bladder-neck obstruction: ICD-10-CM

## 2018-01-01 DIAGNOSIS — I10 Essential (primary) hypertension: Principal | ICD-10-CM

## 2018-01-01 DIAGNOSIS — H9190 Unspecified hearing loss, unspecified ear: ICD-10-CM

## 2018-01-01 DIAGNOSIS — R32 Unspecified urinary incontinence: ICD-10-CM

## 2018-01-01 DIAGNOSIS — J45909 Unspecified asthma, uncomplicated: ICD-10-CM

## 2018-01-01 DIAGNOSIS — M549 Dorsalgia, unspecified: ICD-10-CM

## 2018-01-01 LAB — PROSTATIC SPECIFIC ANTIGEN-PSA

## 2018-04-09 ENCOUNTER — Encounter: Admit: 2018-04-09 | Discharge: 2018-04-09 | Payer: MEDICARE

## 2019-12-11 IMAGING — CT Abdomen^1_KIDNEY_STONE (Adult)
1 series · 15 of 32 positions shown, 19 images · non-contrast
Comparison: none

[Series 2: kidney stone 3.0 soft tissue · axial · 0.88mm/px · z∈[-490,-70]mm · 15 of 157 slices shown, 19 images]
[im 11/157  soft-tissue]
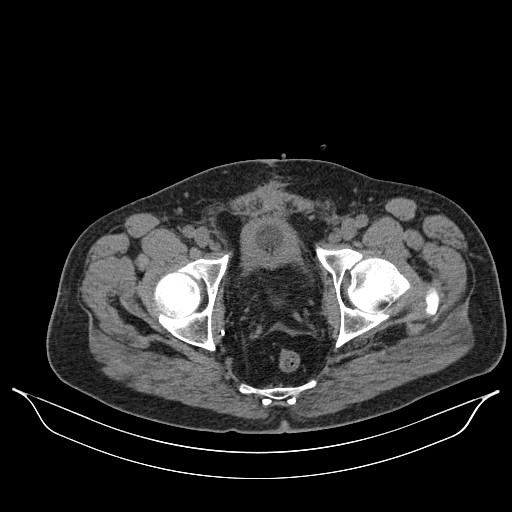
[im 11/157  bone]
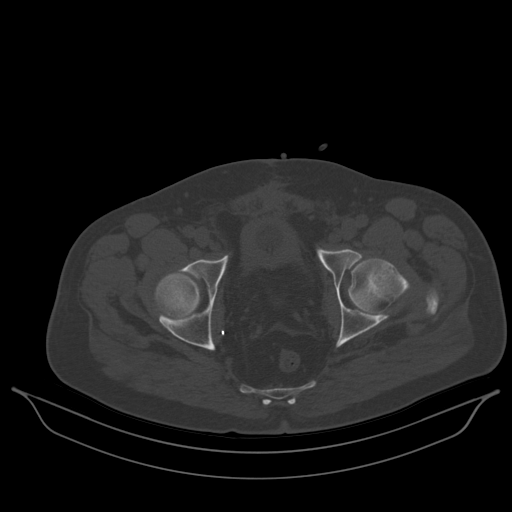
[im 21/157  soft-tissue]
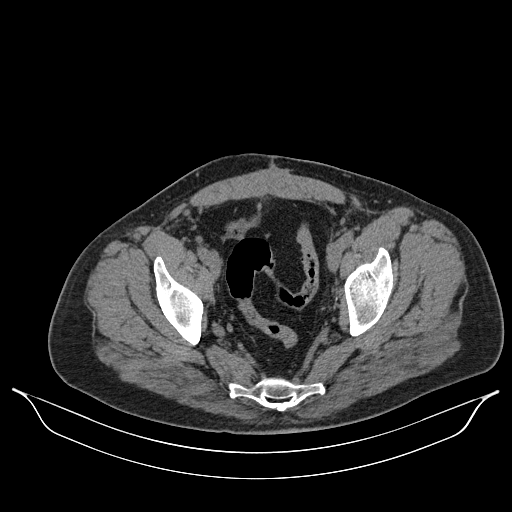
[im 31/157  soft-tissue]
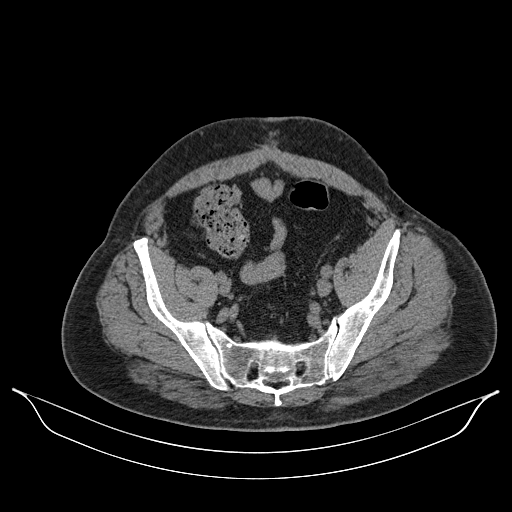
[im 46/157  soft-tissue]
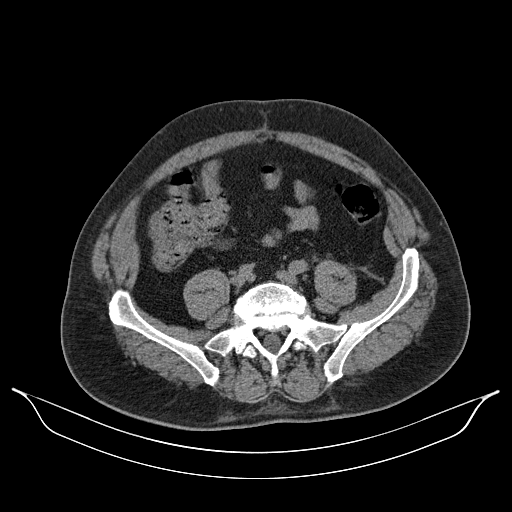
[im 56/157  soft-tissue]
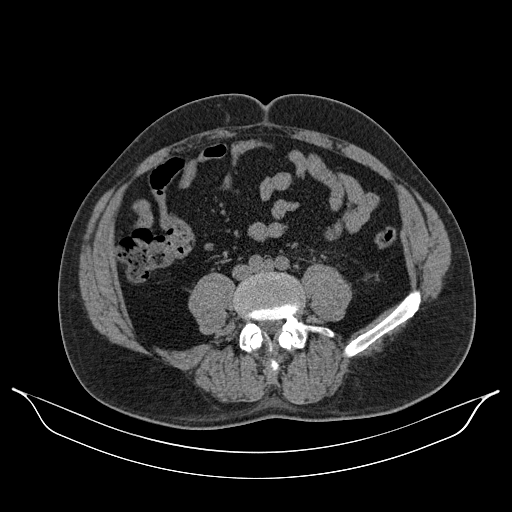
[im 66/157  soft-tissue]
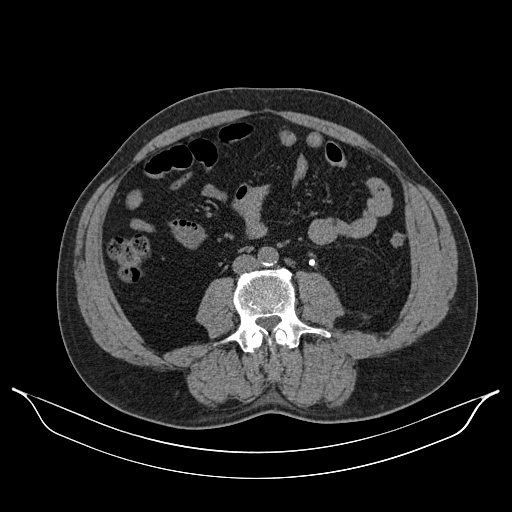
[im 81/157  soft-tissue]
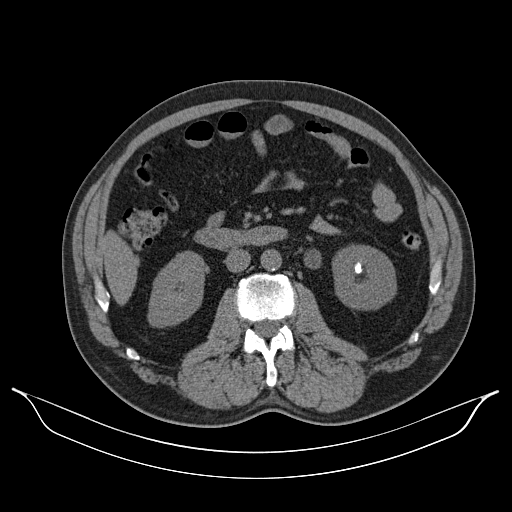
[im 91/157  soft-tissue]
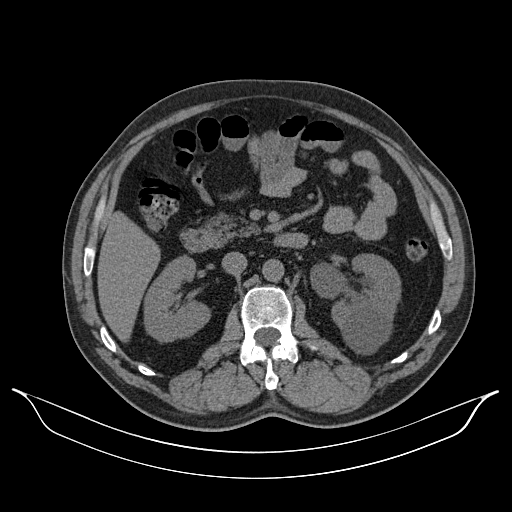
[im 101/157  soft-tissue]
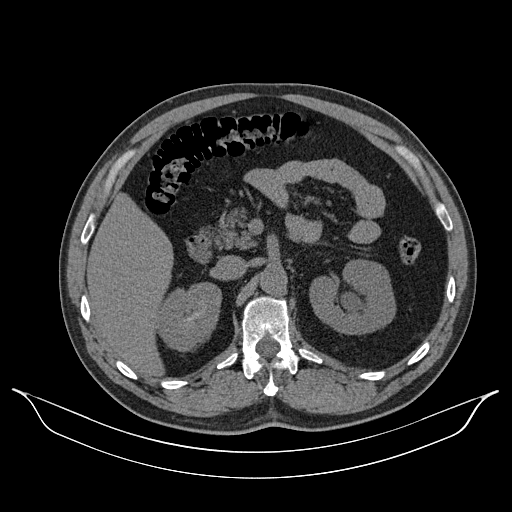
[im 101/157  bone]
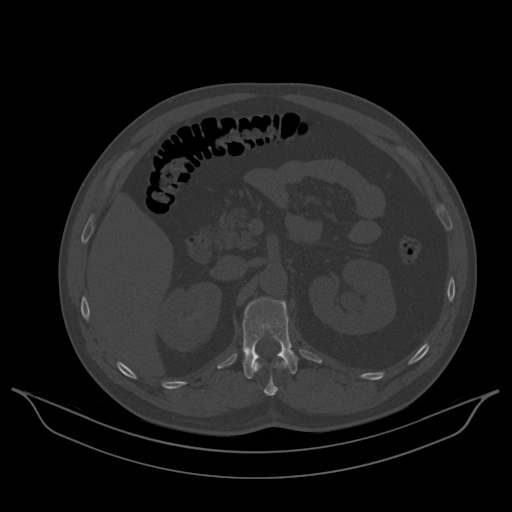
[im 111/157  soft-tissue]
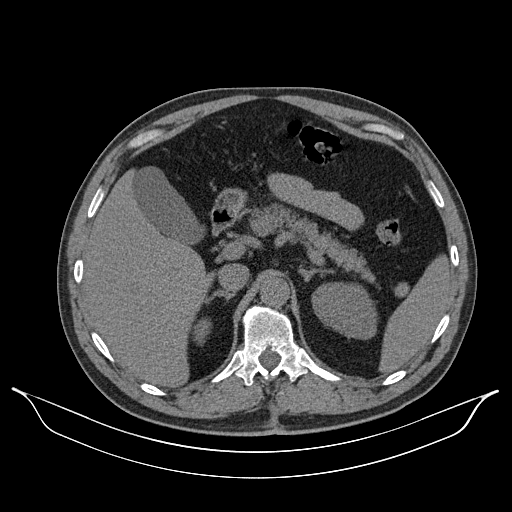
[im 126/157  soft-tissue]
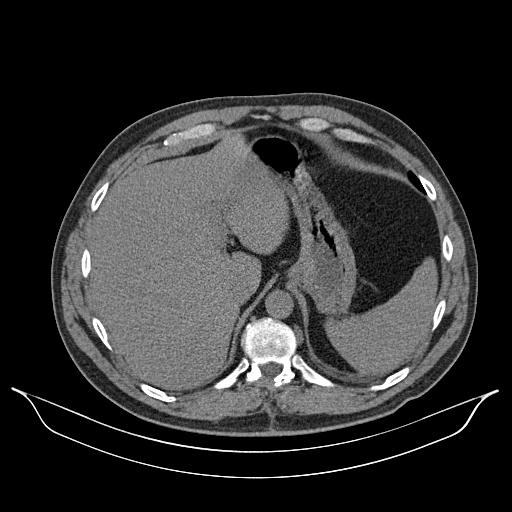
[im 136/157  soft-tissue]
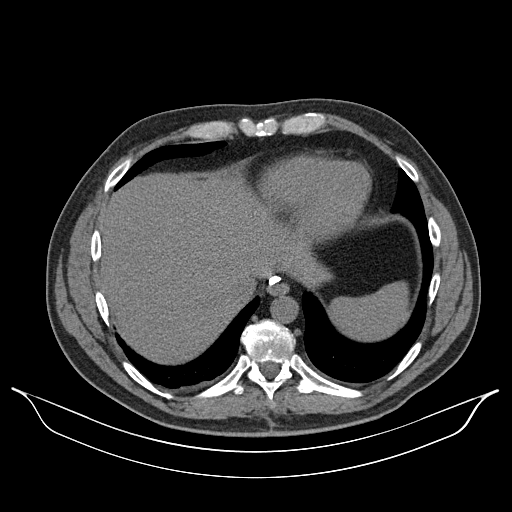
[im 136/157  lung]
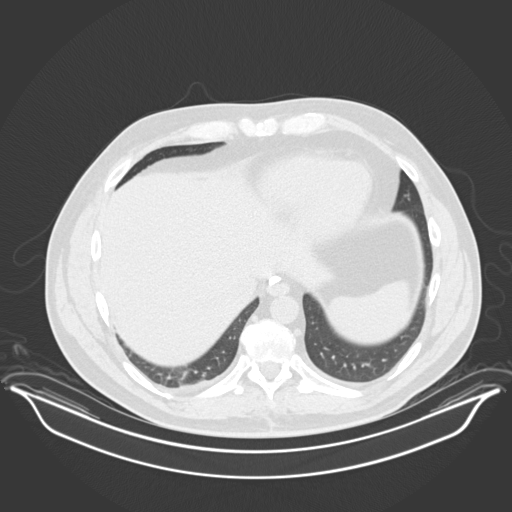
[im 141/157  lung]
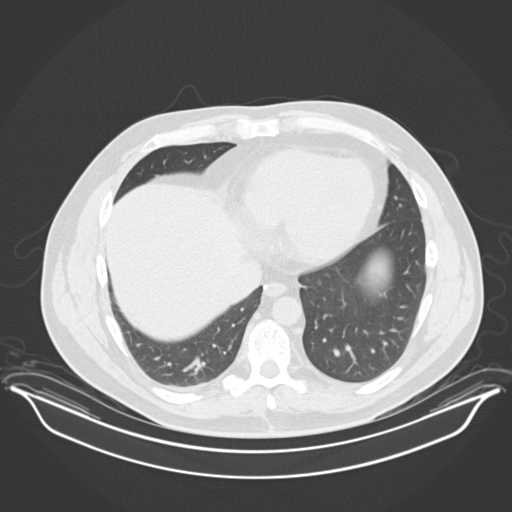
[im 146/157  soft-tissue]
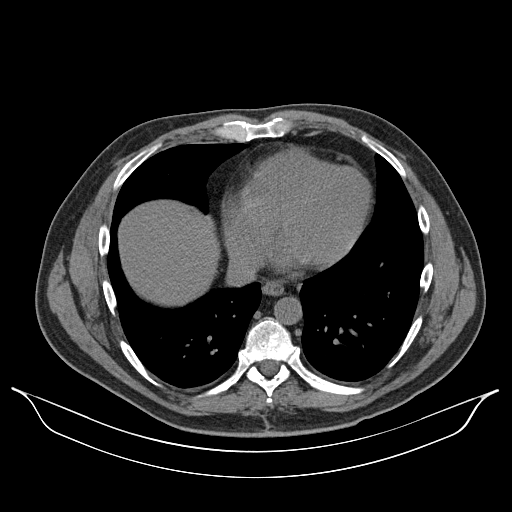
[im 146/157  lung]
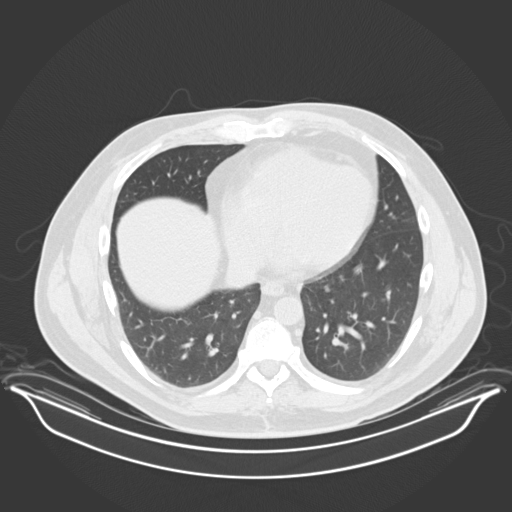
[im 151/157  lung]
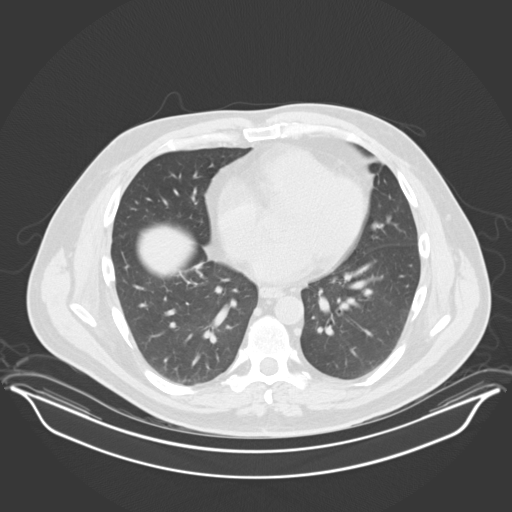

[15 of 32 positions shown; findings below may reference images not displayed]

09/24/17

DIAGNOSTIC STUDIES
All CT scans at this facility use dose modulation, iterative reconstruction, and/or weight based
dosing when appropriate to reduce radiation dose to as low as reasonably achievable.

EXAM

Unenhanced CT abdomen and pelvis

INDICATION
Flank pain on the left . With fever and elevated white count

TECHNIQUE

COMPARISONS

None available

FINDINGS

Heart normal. Lung bases clear.
No focal lesions liver, spleen with splenule. No focal lesions pancreas, adrenals. Gallbladder
fluid filled without stone. No abdominal aortic aneurysm. Nondilated retroperitoneal lymph nodes.
Right kidney demonstrates no intrarenal stone. There is no right hydronephrosis. There is no stone
along the course of the right ureter.
The left kidney demonstrates a 4 millimeter lower pole stone. There is left perinephric stranding.
There is left hydronephrosis and hydroureter down to a 7 millimeters mid left ureteral stone best
seen on coronal image 55 and axial image 93 there is a left renal cyst. Foley catheter seen in the
bladder with
No bowel obstruction or free air. No abdominal mass. No inflammatory process right lower quadrant.
Constipation.
Decompressed bladder with Foley catheter. No inguinal adenopathy. Bone windows demonstrate spinal
degenerative changes most pronounced L5-S1

IMPRESSION
Left hydronephrosis and hydroureter down to a 7 millimeter stone in the mid left ureter as
described above.
4 millimeter nonobstructive left lower renal stone.
Decompressed bladder with Foley catheter. Cannot exclude mild wall thickening and infection.
No right renal stone

Tech Notes:

PT C/O FLANK PAIN. INCREASED WBC, FEVER. RECENT UTI. HX OF PROSTATE CANCER. TJ

## 2019-12-23 IMAGING — CT Abdomen^1_KIDNEY_STONE (Adult)
1 series · 15 of 32 positions shown, 19 images · non-contrast
Comparison: none

[Series 2: kidney stone 3.0 soft tissue · axial · 0.88mm/px · z∈[-478,+26]mm · 15 of 186 slices shown, 19 images]
[im 12/186  soft-tissue]
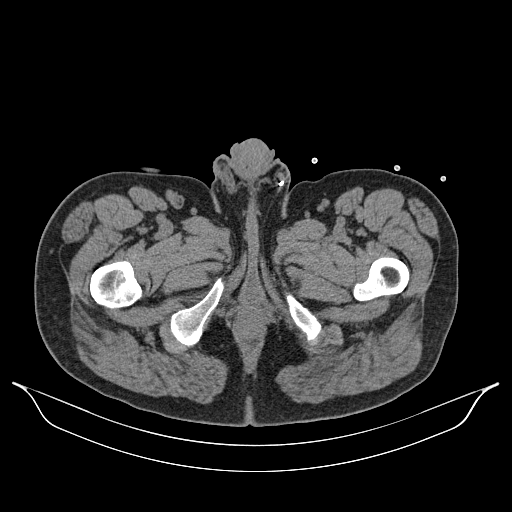
[im 12/186  bone]
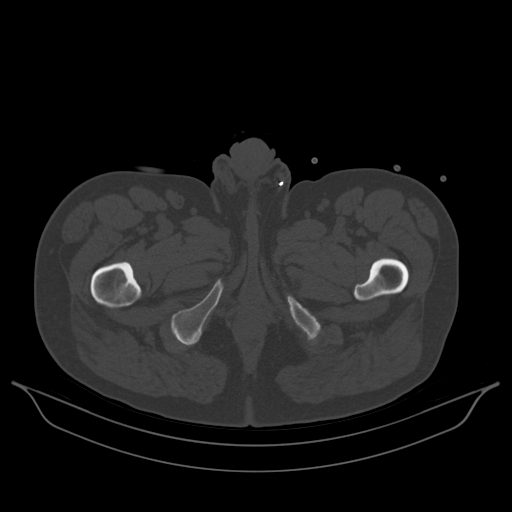
[im 24/186  soft-tissue]
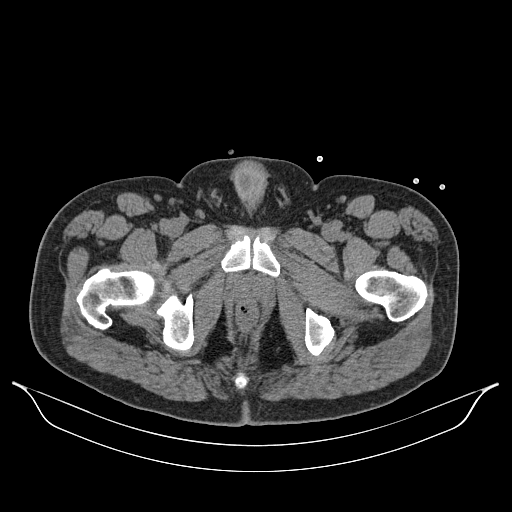
[im 36/186  soft-tissue]
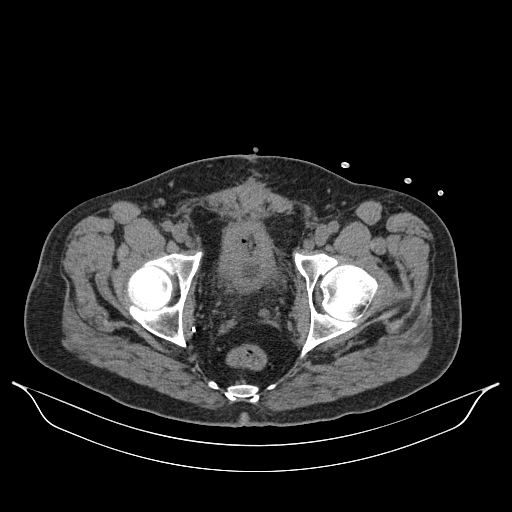
[im 54/186  soft-tissue]
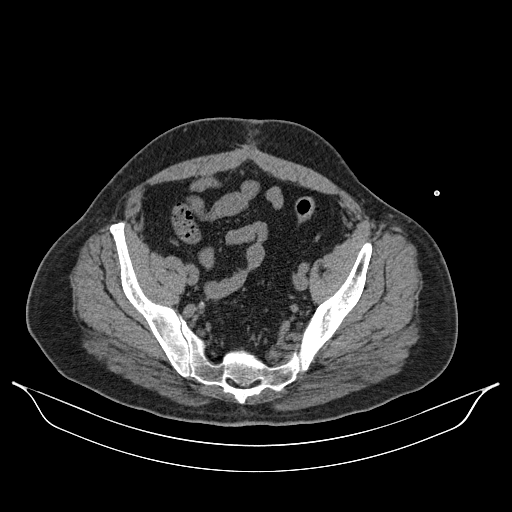
[im 66/186  soft-tissue]
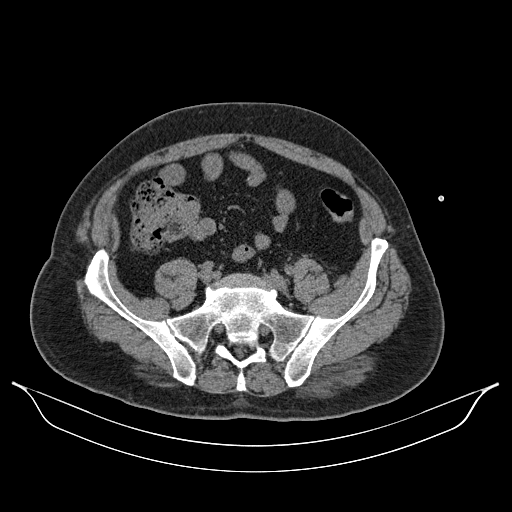
[im 78/186  soft-tissue]
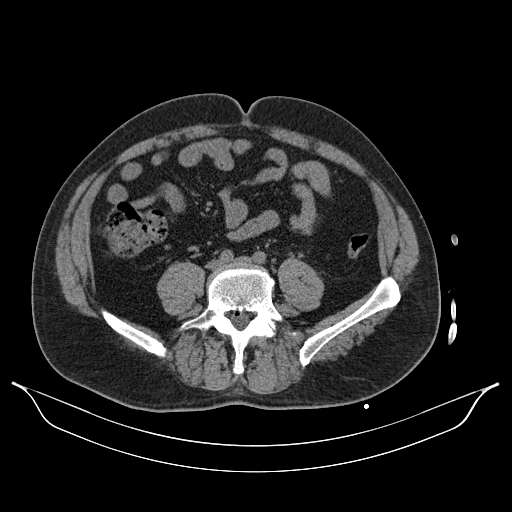
[im 96/186  soft-tissue]
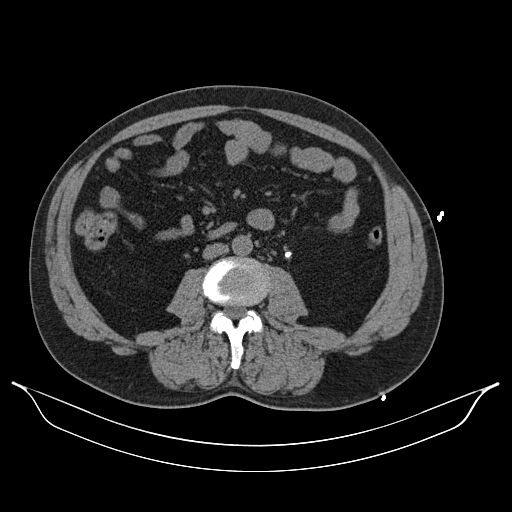
[im 108/186  soft-tissue]
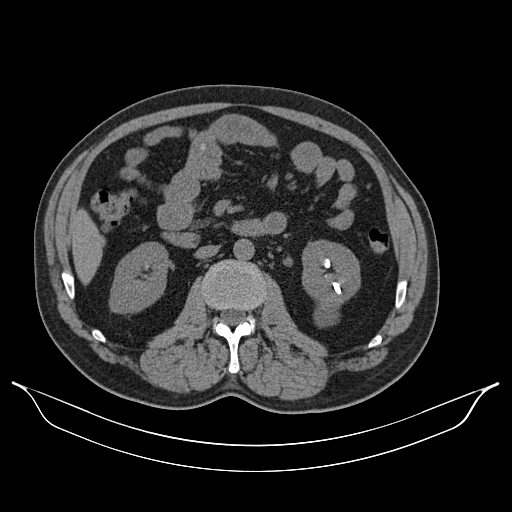
[im 120/186  soft-tissue]
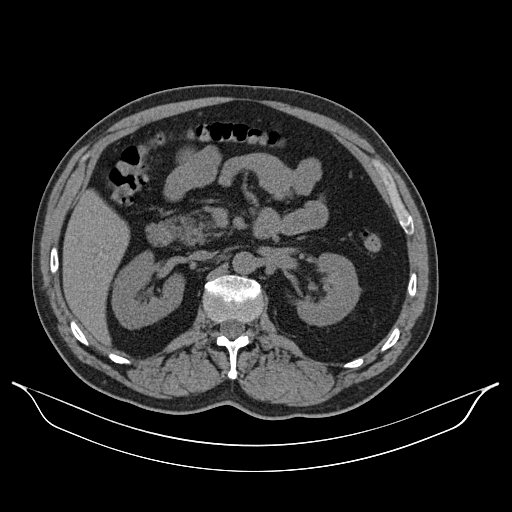
[im 120/186  bone]
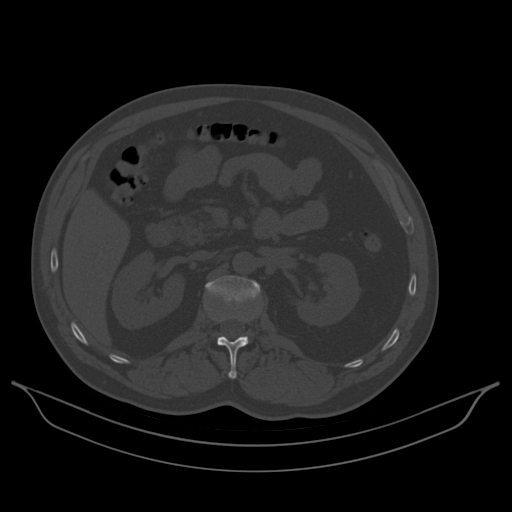
[im 132/186  soft-tissue]
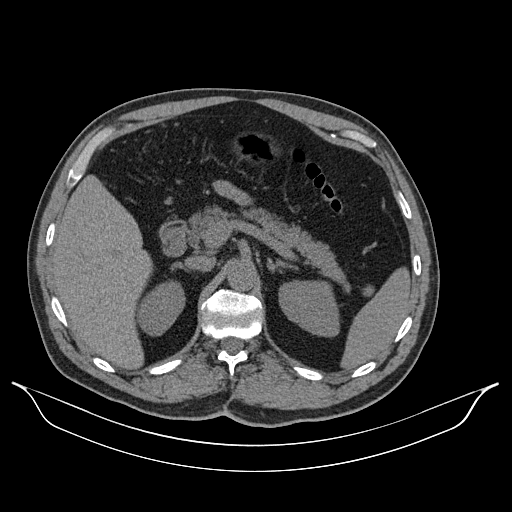
[im 150/186  soft-tissue]
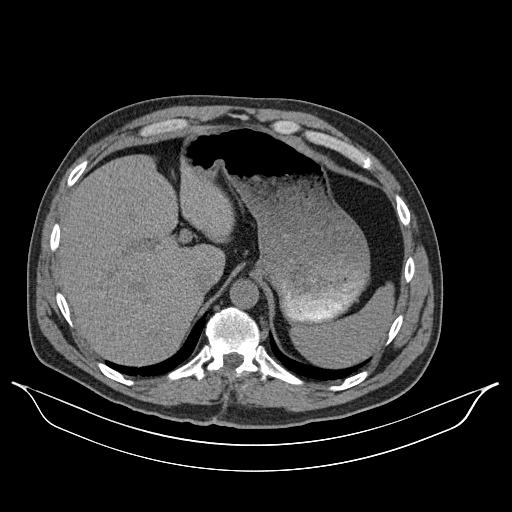
[im 162/186  soft-tissue]
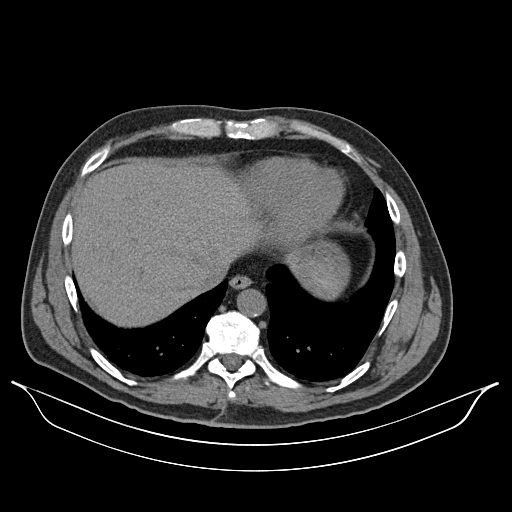
[im 162/186  lung]
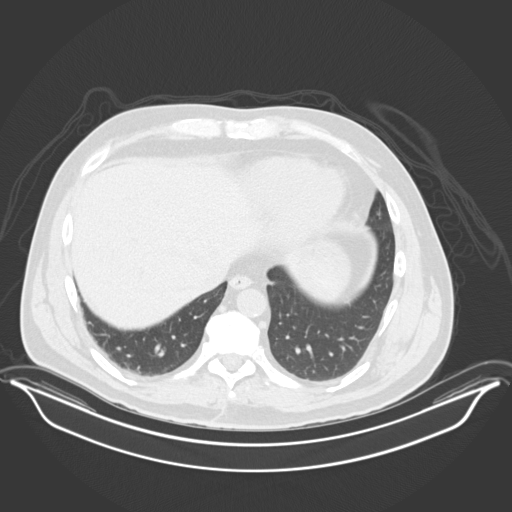
[im 168/186  lung]
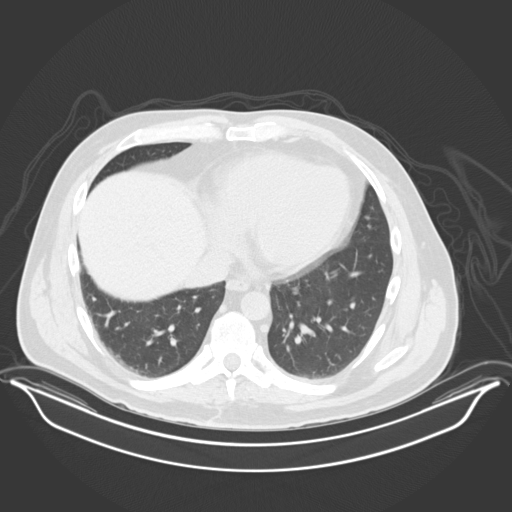
[im 174/186  soft-tissue]
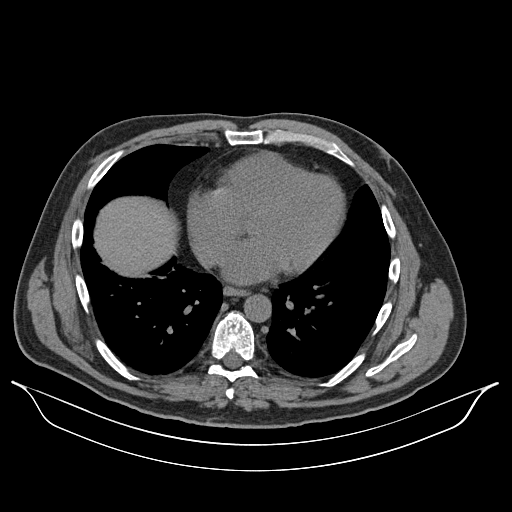
[im 174/186  lung]
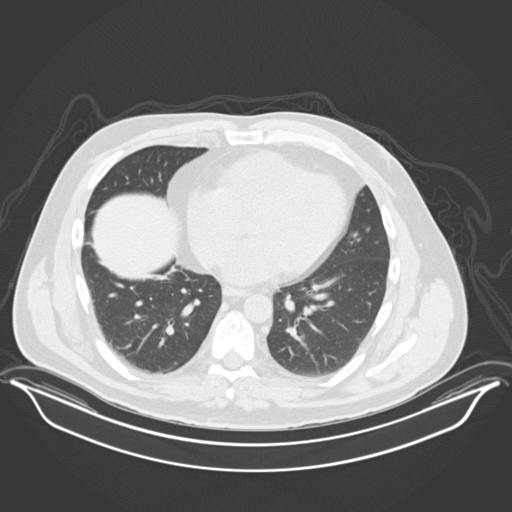
[im 180/186  lung]
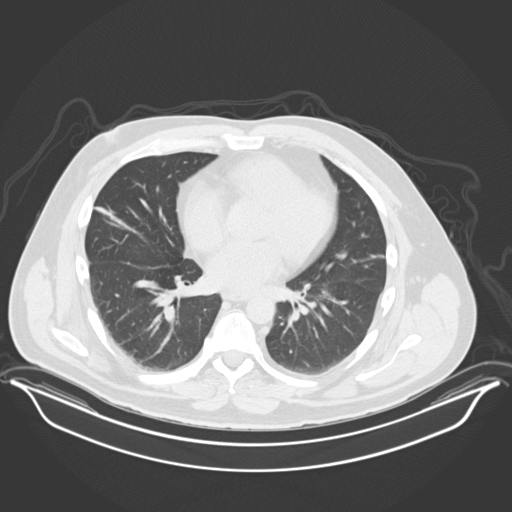

[15 of 32 positions shown; findings below may reference images not displayed]

10/06/17

DIAGNOSTIC STUDIES

EXAM
COMPUTED TOMOGRAPHY, ABDOMEN AND PELVIS; WITHOUT CONTRAST MATERIAL CPT 55351

INDICATION
pelvic pain. Recent Hx ureterolithiasis left side
C/O DECREASED OUTPUT IN FOLEY CATHETER. H/O 7MM LT SIDE URNINARY TRACT STONE. LT SIDE URINARY
STENT. H/O PROSTATE CA. HB

TECHNIQUE
Contiguous axial tomographic images were obtained from the lung bases to the symphysis pubis
without contrast.
All CT scans at this facility use dose modulation, iterative reconstruction, and/or weight based
dosing when appropriate to reduce radiation dose to as low as reasonably achievable.

COMPARISONS
09/24/2017

FINDINGS
[Platelike atelectasis within the lung bases. There is hepatomegaly, 24 centimeters. The spleen,
pancreas, gallbladder and adrenal glands appear grossly unremarkable. No hydronephrosis. Left-sided
nephrostomy tube. 4.3 centimeter exophytic cyst involving the interpolar region of the left kidney.
Redemonstration of a 6 millimeter calculus within the mid left ureter. Small cyst involving the
lower pole of the right kidney. The urinary bladder is nondistended. Suprapubic catheter. No bowel
obstruction or free air. Mild atherosclerotic disease. Subcentimeter retroperitoneal nodes.
Prostatectomy. Moderate degree of degenerative changes at the level of L5/S1.

IMPRESSION
1. The tubes appear to be in good position (left nephrostomy, suprapubic catheter).
2. No hydronephrosis. Redemonstration of a 6 millimeter calculus within the mid left ureter.
3. Prostatectomy.

Tech Notes:

C/O DECREASED OUTPUT IN FOLEY CATHETER. H/O 7MM LT SIDE URNINARY TRACT STONE. LT SIDE URINARY STENT.
H/O PROSTATE CA.  HB

## 2021-02-22 ENCOUNTER — Encounter: Admit: 2021-02-22 | Discharge: 2021-02-22 | Payer: MEDICARE
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
## 2021-03-10 ENCOUNTER — Encounter: Admit: 2021-03-10 | Discharge: 2021-03-10 | Payer: MEDICARE

## 2021-03-10 NOTE — Telephone Encounter
Spoke with patient about his appointment with Dr.Davis. I got him earlier than March appointment. Patient was happy and gave verbal understanding of his new appointment date of 03/24/2021 0830.

## 2021-03-16 ENCOUNTER — Encounter: Admit: 2021-03-16 | Discharge: 2021-03-16 | Payer: MEDICARE

## 2021-03-16 DIAGNOSIS — S129XXA Fracture of neck, unspecified, initial encounter: Secondary | ICD-10-CM

## 2021-03-16 NOTE — Patient Instructions
It was nice to see you today.  Thank you for choosing to visit our clinic.  Your time is important, and if you had to wait today, we do apologize.  Our goal is to run exactly on time.  However, on occasion, we get behind in clinic due to unexpected patient issues.  Thank you for your patience.    General Instructions:  Scheduling:  Our scheduling phone number is [phone].  Appointment Reminders on your cell phone:  Communication preferences can be managed in MyChart to ensure you receive important appointment notifications  How to reach our office:  Please send a MyChart message to the [facility] (directed to Dr. Davis) or leave a voicemail for the nurse, Rama Sorci, at [phone].  How to get a medication refill:  Please use the MyChart Refill request or contact your pharmacy directly to request medication refills.  Please allow 72 business hours for request to be completed.    Support for many chronic illnesses is available through [facility] at [email or web address] or [phone].    For help with MyChart:  please call [phone].    For questions on nights, weekends or holidays:  call the Operator at [phone], and ask for the doctor on call for Neurosurgery.    For more information on spinal conditions:  please visit [email or web address]     Again, thank you for coming in today.

## 2021-03-23 ENCOUNTER — Encounter: Admit: 2021-03-23 | Discharge: 2021-03-23 | Payer: No Typology Code available for payment source

## 2021-03-23 NOTE — Telephone Encounter
Pt stated that he will bring imaging and report with him

## 2021-03-24 ENCOUNTER — Ambulatory Visit: Admit: 2021-03-24 | Discharge: 2021-03-24 | Payer: No Typology Code available for payment source

## 2021-03-24 ENCOUNTER — Encounter: Admit: 2021-03-24 | Discharge: 2021-03-24 | Payer: No Typology Code available for payment source

## 2021-03-24 DIAGNOSIS — C61 Malignant neoplasm of prostate: Secondary | ICD-10-CM

## 2021-03-24 DIAGNOSIS — M549 Dorsalgia, unspecified: Secondary | ICD-10-CM

## 2021-03-24 DIAGNOSIS — H9190 Unspecified hearing loss, unspecified ear: Secondary | ICD-10-CM

## 2021-03-24 DIAGNOSIS — M542 Cervicalgia: Secondary | ICD-10-CM

## 2021-03-24 DIAGNOSIS — S0211AA Type I occipital condyle fracture, right side, initial encounter for closed fracture: Secondary | ICD-10-CM

## 2021-03-24 DIAGNOSIS — J45909 Unspecified asthma, uncomplicated: Secondary | ICD-10-CM

## 2021-03-24 DIAGNOSIS — S129XXA Fracture of neck, unspecified, initial encounter: Secondary | ICD-10-CM

## 2021-03-24 DIAGNOSIS — I10 Essential (primary) hypertension: Secondary | ICD-10-CM

## 2021-03-24 DIAGNOSIS — N529 Male erectile dysfunction, unspecified: Secondary | ICD-10-CM
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
## 2021-04-15 ENCOUNTER — Encounter: Admit: 2021-04-15 | Discharge: 2021-04-15 | Payer: No Typology Code available for payment source

## 2021-04-15 NOTE — Patient Instructions
It was nice to see you today.  Thank you for choosing to visit our clinic.  Your time is important, and if you had to wait today, we do apologize.  Our goal is to run exactly on time.  However, on occasion, we get behind in clinic due to unexpected patient issues.  Thank you for your patience.    General Instructions:  Scheduling:  Our scheduling phone number is [phone].  Appointment Reminders on your cell phone:  Communication preferences can be managed in MyChart to ensure you receive important appointment notifications  How to reach our office:  Please send a MyChart message to the [facility] (directed to Dr. Davis) or leave a voicemail for the nurse, Linsay Vogt, at [phone].  How to get a medication refill:  Please use the MyChart Refill request or contact your pharmacy directly to request medication refills.  Please allow 72 business hours for request to be completed.    Support for many chronic illnesses is available through [facility] at [email or web address] or [phone].    For help with MyChart:  please call [phone].    For questions on nights, weekends or holidays:  call the Operator at [phone], and ask for the doctor on call for Neurosurgery.    For more information on spinal conditions:  please visit [email or web address]     Again, thank you for coming in today.

## 2021-04-16 ENCOUNTER — Encounter: Admit: 2021-04-16 | Discharge: 2021-04-16 | Payer: No Typology Code available for payment source

## 2021-04-19 ENCOUNTER — Encounter: Admit: 2021-04-19 | Discharge: 2021-04-19 | Payer: No Typology Code available for payment source

## 2021-04-19 DIAGNOSIS — S0211AA Type I occipital condyle fracture, right side, initial encounter for closed fracture: Secondary | ICD-10-CM

## 2021-04-21 ENCOUNTER — Ambulatory Visit: Admit: 2021-04-21 | Discharge: 2021-04-21 | Payer: No Typology Code available for payment source

## 2021-04-21 ENCOUNTER — Encounter: Admit: 2021-04-21 | Discharge: 2021-04-21 | Payer: No Typology Code available for payment source

## 2021-04-21 DIAGNOSIS — I10 Essential (primary) hypertension: Secondary | ICD-10-CM

## 2021-04-21 DIAGNOSIS — J45909 Unspecified asthma, uncomplicated: Secondary | ICD-10-CM

## 2021-04-21 DIAGNOSIS — H9190 Unspecified hearing loss, unspecified ear: Secondary | ICD-10-CM

## 2021-04-21 DIAGNOSIS — C61 Malignant neoplasm of prostate: Secondary | ICD-10-CM

## 2021-04-21 DIAGNOSIS — M545 Chronic bilateral low back pain, unspecified whether sciatica present: Secondary | ICD-10-CM

## 2021-04-21 DIAGNOSIS — M542 Cervicalgia: Secondary | ICD-10-CM

## 2021-04-21 DIAGNOSIS — M47816 Spondylosis without myelopathy or radiculopathy, lumbar region: Secondary | ICD-10-CM

## 2021-04-21 DIAGNOSIS — M549 Dorsalgia, unspecified: Secondary | ICD-10-CM

## 2021-04-21 DIAGNOSIS — S0211AA Type I occipital condyle fracture, right side, initial encounter for closed fracture: Secondary | ICD-10-CM

## 2021-04-21 DIAGNOSIS — N529 Male erectile dysfunction, unspecified: Secondary | ICD-10-CM
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
## 2021-05-08 ENCOUNTER — Encounter: Admit: 2021-05-08 | Discharge: 2021-05-08 | Payer: No Typology Code available for payment source

## 2021-05-08 DIAGNOSIS — S0211AA Type I occipital condyle fracture, right side, initial encounter for closed fracture: Secondary | ICD-10-CM

## 2021-05-09 NOTE — Patient Instructions
It was nice to see you today.  Thank you for choosing to visit our clinic.  Your time is important, and if you had to wait today, we do apologize.  Our goal is to run exactly on time.  However, on occasion, we get behind in clinic due to unexpected patient issues.  Thank you for your patience.    General Instructions:  Scheduling:  Our scheduling phone number is [phone].  Appointment Reminders on your cell phone:  Communication preferences can be managed in MyChart to ensure you receive important appointment notifications  How to reach our office:  Please send a MyChart message to the [facility] (directed to Dr. Davis) or leave a voicemail for the nurse, Dilon Lank, at [phone].  How to get a medication refill:  Please use the MyChart Refill request or contact your pharmacy directly to request medication refills.  Please allow 72 business hours for request to be completed.    Support for many chronic illnesses is available through [facility] at [email or web address] or [phone].    For help with MyChart:  please call [phone].    For questions on nights, weekends or holidays:  call the Operator at [phone], and ask for the doctor on call for Neurosurgery.    For more information on spinal conditions:  please visit [email or web address]     Again, thank you for coming in today.

## 2021-05-31 ENCOUNTER — Encounter: Admit: 2021-05-31 | Discharge: 2021-05-31 | Payer: No Typology Code available for payment source

## 2021-05-31 DIAGNOSIS — S129XXA Fracture of neck, unspecified, initial encounter: Secondary | ICD-10-CM

## 2021-06-02 ENCOUNTER — Encounter: Admit: 2021-06-02 | Discharge: 2021-06-02 | Payer: No Typology Code available for payment source

## 2021-06-02 ENCOUNTER — Ambulatory Visit: Admit: 2021-06-02 | Discharge: 2021-06-02 | Payer: No Typology Code available for payment source

## 2021-06-02 DIAGNOSIS — S129XXA Fracture of neck, unspecified, initial encounter: Secondary | ICD-10-CM

## 2021-06-02 DIAGNOSIS — N529 Male erectile dysfunction, unspecified: Secondary | ICD-10-CM

## 2021-06-02 DIAGNOSIS — I10 Essential (primary) hypertension: Secondary | ICD-10-CM

## 2021-06-02 DIAGNOSIS — M549 Dorsalgia, unspecified: Secondary | ICD-10-CM

## 2021-06-02 DIAGNOSIS — S0211AD Type I occipital condyle fracture, right side, subsequent encounter for fracture with routine healing: Secondary | ICD-10-CM

## 2021-06-02 DIAGNOSIS — C61 Malignant neoplasm of prostate: Secondary | ICD-10-CM

## 2021-06-02 DIAGNOSIS — H9190 Unspecified hearing loss, unspecified ear: Secondary | ICD-10-CM

## 2021-06-02 DIAGNOSIS — J45909 Unspecified asthma, uncomplicated: Secondary | ICD-10-CM

## 2021-06-02 NOTE — Progress Notes
Subjective:       History of Present Illness  Pedro Roberts is a 56 y.o. male.  He presents for follow-up of his right occipital condyle fracture now that he has been out of his collar for several weeks.  He denies any significant neck pain or extremity symptoms at this time.             Objective:          lisinopril (PRINIVIL; ZESTRIL) 10 mg tablet Take one tablet by mouth daily.    sertraline (ZOLOFT) 100 mg tablet Take one tablet by mouth daily.    traZODone (DESYREL) 100 mg tablet Take one tablet by mouth at bedtime daily.     Vitals:    06/02/21 1004   BP: (!) 152/110   BP Source: Arm, Left Upper   Pulse: 96   SpO2: 99%   PainSc: Two     There is no height or weight on file to calculate BMI.     Physical Exam  Awake, alert, and oriented x4  Motor 5/5  Sensation intact  Cervical spine nontender to palpation  Active range of motion without pain  I independently reviewed standing cervical films performed in clinic today which demonstrate stable conformation of the cervical spine without abnormal motion on flexion/extension views.       Assessment and Plan:     Patient appears to have healed well from his injury.  I will see him back on an as-needed basis.

## 2021-09-06 ENCOUNTER — Encounter: Admit: 2021-09-06 | Discharge: 2021-09-06 | Payer: No Typology Code available for payment source

## 2023-08-14 ENCOUNTER — Encounter: Admit: 2023-08-14 | Discharge: 2023-08-14 | Payer: PRIVATE HEALTH INSURANCE

## 2023-09-25 ENCOUNTER — Encounter: Admit: 2023-09-25 | Discharge: 2023-09-25 | Payer: PRIVATE HEALTH INSURANCE

## 2023-09-25 NOTE — Telephone Encounter
 Requesting sleep study from [facility]

## 2023-09-26 ENCOUNTER — Encounter: Admit: 2023-09-26 | Discharge: 2023-09-26 | Payer: PRIVATE HEALTH INSURANCE

## 2023-09-26 ENCOUNTER — Ambulatory Visit: Admit: 2023-09-26 | Discharge: 2023-09-27 | Payer: PRIVATE HEALTH INSURANCE

## 2023-09-26 DIAGNOSIS — Z789 Other specified health status: Secondary | ICD-10-CM

## 2023-09-26 DIAGNOSIS — G4733 Obstructive sleep apnea (adult) (pediatric): Principal | ICD-10-CM

## 2023-09-26 DIAGNOSIS — Z6835 Body mass index (BMI) 35.0-35.9, adult: Secondary | ICD-10-CM

## 2023-09-26 NOTE — Progress Notes
 HISTORY OF PRESENT ILLNESS:  Pedro Roberts is a 58 y.o. male who presents to clinic today on consultation from the [facility] for obstructive sleep apnea and CPAP intolerance.  Patient had a sleep study showing an overall AHI of 54.2.  BMI at the time was 35.4 he did have hypoxemia.  Minimum oxygen saturation of 58%.  318 minutes below 88%.  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  He has been on CPAP and has not been successful.  Cannot keep it on past an hour or so.  Has tried using it every night.  Went through desensitization and has been on multiple masks.  Was interested in hypoglossal nerve stimulator placement.  There was concern by his Pedro Roberts provider that sleep-related hypoxemia would not be treated as well by inspire.       Review of Systems   Constitutional: Negative.    HENT: Negative.          Snoring   Eyes: Negative.    Respiratory:  Positive for apnea, shortness of breath and wheezing.    Cardiovascular: Negative.    Gastrointestinal: Negative.    Endocrine: Negative.    Genitourinary: Negative.    Musculoskeletal: Negative.    Skin: Negative.    Allergic/Immunologic: Negative.    Neurological: Negative.    Hematological: Negative.    Psychiatric/Behavioral:  Positive for sleep disturbance.      Past Medical/Surgical History  He  has a past medical history of Asthma, Back pain, Cancer of prostate (CMS-HCC), Erectile dysfunction, Hearing reduced, and Hypertension.  His  has a past surgical history that includes appendectomy; back surgery; Kidney stone surgery (2001/2003/2004); Prostate surgery (2019); vasectomy (2004); cystourethroscopy (Bilateral, 08/02/2017); Kidney stone surgery (Left, 10/10/2017); cystourethroscopy (Left, 10/10/2017); cystourethroscopy (Bilateral, 10/24/2017); cystourethroscopy (Left, 10/24/2017); and bladder repair (N/A, 10/24/2017).        Medications/Allergies/Immunizations  His current medication(s) include:   Current Outpatient Medications   Medication Sig Dispense Refill    amLODIPine (NORVASC) 5 mg tablet Take one tablet by mouth daily.      atorvastatin (LIPITOR) 40 mg tablet Take one-half tablet by mouth daily.      chlorthalidone (HYGROTON) 25 mg tablet Take one tablet by mouth daily.      CHOLEcalciferoL (vitamin D3) 50 mcg (2,000 unit) tablet Take one tablet by mouth every 7 days.      lisinopril  (PRINIVIL ; ZESTRIL ) 10 mg tablet Take one tablet by mouth daily.      metFORMIN-XR (GLUCOPHAGE XR) 750 mg extended release tablet Take one tablet by mouth daily with dinner.      metoprolol XL (TOPROL XL) 200 mg extended release tablet Take one tablet by mouth daily.      potassium chloride SR (K-DUR) 20 mEq tablet Take one tablet by mouth daily.      sertraline  (ZOLOFT ) 100 mg tablet Take one tablet by mouth daily.      traZODone  (DESYREL ) 100 mg tablet Take one tablet by mouth at bedtime daily.       No current facility-administered medications for this visit.       Allergies: Patient has no known allergies.          Vitals:    09/26/23 1033   PainSc: Three   Weight: 121.4 kg (267 lb 9.6 oz)   Height: 185.4 cm (6' 1)     Body mass index is 35.31 kg/m?Pedro Roberts     Physical Exam    General: 58  y.o. male who is awake and alert and no acute distress.  BMI 35.3  SKIN:  Skin examination was unremarkable no mass or lesion appreciated no evidence of cellulitis.   EYES: Extraocular muscle mobility is intact.  No conjunctival hemorrhage.   EARS:The auricles were without deformity.  External auditory canals are clear no evidence of cerumen fungus or bacterial infection.  Tympanic membranes are without effusion or retraction.  No evidence of perforation.  No cholesteatoma.    NOSE: The nasal airway shows a straight septum without evidence of perforation or significant crusting.  There are no evidence of polypoid changes.  The inferior turbinate is not congested.  There are no active bleeding sites.    ORAL CAVITY: The oral cavity shows buccal surfaces are without evidence of lichenoid changes or mucosal disease.  No leukoplakia.  The floor of mouth is without edema.  Wharton's ducts and Stensen's ducts are patent with normal salivary flow.  The tongue is without mass or lesion. There is normal mobility and sensation.    OROPHARYNX: The oropharynx shows normal mucosa.   No significant postnasal drainage.  Uvula is without edema.  No obvious oropharyngeal obstruction  NECK: Neck was flat no adenopathy or thyromegaly.  No parotid masses or submandibular gland masses.  Does have an enlarged neck circumference  NEURO: Cranial nerves are intact bilaterally.  Voice quality is excellent.   PULMONARY:  No airway distress.  No stridor.  No retractions.        ASSESSMENT AND PLAN:       Pedro Roberts Has a history of obstructive sleep apnea and CPAP intolerance.  Has an appropriate body mass index and has tried and failed CPAP as detailed above.  Recent sleep study shows at least moderate obstructive sleep apnea without significant central events.  At this point would be a candidate for further work-up towards hypoglossal nerve stimulator placement.  We will start with drug-induced sleep endoscopy.  Discussed the risk and potential complications of both drug-induced sleep endoscopy and hypoglossal nerve stimulator placement.  These would include but are not limited to continued sleep apnea, device failure, need for battery change, infection, bleeding, scarring, nerve damage, difficulties with certain MRI scans, pain.  The patient understands and is wanting to proceed with continued work-up.  He understands that he may require supplemental oxygen at night

## 2023-09-26 NOTE — Patient Instructions
 General Instructions for Surgery Patients      Surgery Date:  7/21  The OR will call 2-3 days prior to surgery with a time to arrive.      Surgeon: Dr. Geraldene                                      Nurse Coordinator: Roderick Guild RN     Contact Information:  Monday - Friday 8:00 - 4:30  Main Office phone number: [phone] option #1    When to Arrive/Surgery Time:  A Pre-Assessment Nurse will contact you a few days prior to your surgery/procedure to review your medical history and provide any additional instructions.  If you have not received a phone call from a Pre-Assessment Nurse by 3:00 two days prior to your scheduled surgery/procedure please call [employer]: [phone].    What to bring:  Bring a list of all medication you are currently taking.    Bring your ID and insurance/prescription cards.    If you have a living will or a durable power of attorney please bring a copy with you, if needed this can be included in your chart.    Bring containers for your glasses, contacts, or dentures.  Please leave valuable items such as jewelry and money at home.   As a courtesy to other families and visitors, please limit the number of your waiting room visitor to ONE.     Preparing for your surgery or procedure:  Bathe or shower the night before or morning of your procedure.  Dress comfortably.  Remove all jewelry including any body piercings.    Do not wear make-up or fingernail polish.      Eating or drinking before surgery:  Nothing to eat after 12:00am the night before your surgery including gum, mints, and candy.      Medications:  For your safety, some medications might need to be stopped before your upcoming surgery/procedure.  Please make us  aware of all prescription, over the counter, or supplments you are taking.  The surgical team will let you know whether to take any doses of your prescription medications the morning of your procedure.        Blood Thinners:  The following medicines can thin the blood and should be avoided 7 days before your surgery. Please contact the prescribing physician for recommendation regarding holding these medications prior to surgery.  Warfarin (Coumadin)  Heparin   Lovenox   Plavix  Ticlid  Xarelto  Pradaxa  Persantine  Ecotrin  Excedrin  Pamprin  Heart Medications:  Continue all heart medications unless you are told to stop by the team or your Cardiologist.     Aspirin and Acetaminophen  (Tylenol )  Do NOT take aspirin for pain.  If you have been told to take aspirin for a heart condition or to prevent/treat stroke, continue taking aspirin and the [facility] team will give you detailed instructions.  It is OKAY to take acetaminophen  (Tylenol ) prior to your procedure.      Specific medication instructions for 14 days prior to your surgery/procedure:  14 days prior to your procedure, STOP taking most vitamins, herbals, and supplements.    The following medication are OKAY TO CONTINUE:  Calcium  Folic Acid  Iron  Magnesium  Potassium  Probiotic  Vitamin B  Vitamin D  Zinc     7 days prior to your procedure, STOP taking the following weekly  diabetic or weight loss medications:  (Saxenda) Liraglutide  (Trulicity) Dulaflutide  (Ozempic) Semaglutide  [place]) Semaglutide    Going Home:  You must have someone available to drive you home after your surgery.    Notify your Physician if:  You become ill the day before your procedure with fever, cold symptoms, sore throat, nausea, vomiting,  or other illness symptoms.     [facility] Address and Instructions:  [address]  [place], [place] [address]

## 2023-09-28 ENCOUNTER — Encounter: Admit: 2023-09-28 | Discharge: 2023-09-28 | Payer: PRIVATE HEALTH INSURANCE

## 2023-10-01 ENCOUNTER — Ambulatory Visit: Admit: 2023-10-01 | Discharge: 2023-10-01 | Payer: PRIVATE HEALTH INSURANCE

## 2023-10-02 ENCOUNTER — Encounter: Admit: 2023-10-02 | Discharge: 2023-10-02 | Payer: PRIVATE HEALTH INSURANCE

## 2023-10-02 ENCOUNTER — Ambulatory Visit: Admit: 2023-10-02 | Discharge: 2023-09-28 | Payer: PRIVATE HEALTH INSURANCE

## 2023-10-02 ENCOUNTER — Ambulatory Visit: Admit: 2023-10-02 | Discharge: 2023-10-02 | Payer: PRIVATE HEALTH INSURANCE

## 2023-10-02 MED ORDER — LIDOCAINE (PF) 20 MG/ML (2 %) IJ SOLN
INTRAVENOUS | 0 refills | Status: DC
Start: 2023-10-02 — End: 2023-10-02

## 2023-10-02 MED ORDER — GLYCOPYRROLATE 0.2 MG/ML IJ SOLN
INTRAVENOUS | 0 refills | Status: DC
Start: 2023-10-02 — End: 2023-10-02

## 2023-10-02 MED ORDER — PROPOFOL 10 MG/ML IV EMUL 50 ML (INFUSION)(AM)(OR)
INTRAVENOUS | 0 refills | Status: DC
Start: 2023-10-02 — End: 2023-10-02
